# Patient Record
Sex: Male | Born: 1980
Health system: Southern US, Community
[De-identification: ages and names within clinical notes are randomized; demographics above are authoritative.]

## PROBLEM LIST (undated history)

## (undated) DIAGNOSIS — F909 Attention-deficit hyperactivity disorder, unspecified type: Secondary | ICD-10-CM

## (undated) HISTORY — PX: INNER EAR SURGERY: SHX679

## (undated) HISTORY — PX: TONSILLECTOMY: SUR1361

## (undated) HISTORY — PX: NOSE SURGERY: SHX723

---

## 2007-05-09 ENCOUNTER — Emergency Department (HOSPITAL_COMMUNITY): Admission: EM | Admit: 2007-05-09 | Discharge: 2007-05-09 | Payer: Self-pay | Admitting: Emergency Medicine

## 2007-10-10 ENCOUNTER — Emergency Department (HOSPITAL_COMMUNITY): Admission: EM | Admit: 2007-10-10 | Discharge: 2007-10-10 | Payer: Self-pay | Admitting: Emergency Medicine

## 2009-07-15 ENCOUNTER — Ambulatory Visit (HOSPITAL_COMMUNITY): Admission: RE | Admit: 2009-07-15 | Discharge: 2009-07-15 | Payer: Self-pay | Admitting: Orthopaedic Surgery

## 2009-12-22 ENCOUNTER — Encounter: Admission: RE | Admit: 2009-12-22 | Discharge: 2009-12-22 | Payer: Self-pay | Admitting: Otolaryngology

## 2011-01-31 ENCOUNTER — Ambulatory Visit (HOSPITAL_COMMUNITY)
Admission: RE | Admit: 2011-01-31 | Discharge: 2011-01-31 | Disposition: A | Discharge status: Home / Self Care | Payer: BC Managed Care – PPO | Source: Ambulatory Visit | Attending: Pulmonary Disease | Admitting: Pulmonary Disease

## 2011-01-31 ENCOUNTER — Other Ambulatory Visit (HOSPITAL_COMMUNITY): Payer: Self-pay | Admitting: Pulmonary Disease

## 2011-01-31 DIAGNOSIS — J342 Deviated nasal septum: Secondary | ICD-10-CM | POA: Insufficient documentation

## 2011-01-31 DIAGNOSIS — R51 Headache: Secondary | ICD-10-CM | POA: Insufficient documentation

## 2011-01-31 DIAGNOSIS — S022XXA Fracture of nasal bones, initial encounter for closed fracture: Secondary | ICD-10-CM

## 2011-01-31 DIAGNOSIS — S0993XA Unspecified injury of face, initial encounter: Secondary | ICD-10-CM | POA: Insufficient documentation

## 2011-01-31 DIAGNOSIS — X58XXXA Exposure to other specified factors, initial encounter: Secondary | ICD-10-CM | POA: Insufficient documentation

## 2012-05-30 ENCOUNTER — Encounter (HOSPITAL_COMMUNITY): Payer: Self-pay | Admitting: *Deleted

## 2012-05-30 ENCOUNTER — Emergency Department (HOSPITAL_COMMUNITY)
Admission: EM | Admit: 2012-05-30 | Discharge: 2012-05-30 | Disposition: A | Discharge status: Home / Self Care | Payer: BC Managed Care – PPO | Attending: Emergency Medicine | Admitting: Emergency Medicine

## 2012-05-30 ENCOUNTER — Emergency Department (HOSPITAL_COMMUNITY): Payer: BC Managed Care – PPO

## 2012-05-30 DIAGNOSIS — W1789XA Other fall from one level to another, initial encounter: Secondary | ICD-10-CM | POA: Insufficient documentation

## 2012-05-30 DIAGNOSIS — IMO0002 Reserved for concepts with insufficient information to code with codable children: Secondary | ICD-10-CM | POA: Insufficient documentation

## 2012-05-30 DIAGNOSIS — H539 Unspecified visual disturbance: Secondary | ICD-10-CM | POA: Insufficient documentation

## 2012-05-30 DIAGNOSIS — S0990XA Unspecified injury of head, initial encounter: Secondary | ICD-10-CM | POA: Insufficient documentation

## 2012-05-30 DIAGNOSIS — M542 Cervicalgia: Secondary | ICD-10-CM | POA: Insufficient documentation

## 2012-05-30 DIAGNOSIS — R51 Headache: Secondary | ICD-10-CM | POA: Insufficient documentation

## 2012-05-30 HISTORY — DX: Attention-deficit hyperactivity disorder, unspecified type: F90.9

## 2012-05-30 NOTE — ED Notes (Signed)
C- Collar removed by MD at bedside.

## 2012-05-30 NOTE — Discharge Instructions (Signed)
Rest this weekend.  Tylenol or motrin for pain.  Follow up with your md next week for recheck

## 2012-05-30 NOTE — ED Notes (Addendum)
Fell app 12 feet from retaining wall , struck rt side of head  Contusion to rt upper chest.  Loc after fall,  Memory problem after fall.  Alert, talking, Rt arm "sore" C collar placed at triage

## 2012-05-30 NOTE — ED Notes (Signed)
States that he is feeling okay, states he has a little bit of a headache.  No numbness or tingling.  States that his vision is "off" in his right eye from his baseline.

## 2012-05-30 NOTE — ED Provider Notes (Cosign Needed)
History   This chart was scribed for David Lennert, MD by Charolett Bumpers . The patient was seen in room APA08/APA08.    CSN: [id number]  Arrival date & time 05/30/12  1478   First MD Initiated Contact with Patient 05/30/12 1830      Chief Complaint  Patient presents with  . Fall    (Consider location/radiation/quality/duration/timing/severity/associated sxs/prior treatment) HPI Comments: Patient states that he feel 12 feet from a retaining wall just PTA. Patient states that he hit the right side of his chest and chin. Patient reports associated right shoulder pain and headache. Patient states that he doesn't remember 30 minutes after falling. Patient reports LOC after fall. Patient describes right shoulder pain as soreness. Patient also reports blurry vision in right eye on his way to ED. Patient denies any other injuries. Patient states that the impact surface was level dirt.   PCP: Dr. Ouida Sills  Patient is a 31 y.o. male presenting with fall. The history is provided by the patient.  Fall The accident occurred less than 1 hour ago. He fell from a height of 11 to 15 ft. He landed on dirt. There was no blood loss. The point of impact was the head (and right side of chest). The pain is present in the right shoulder and head. The pain is mild. He was ambulatory at the scene. Associated symptoms include headaches and loss of consciousness. He has tried nothing for the symptoms.    Past Medical History  Diagnosis Date  . ADHD (attention deficit hyperactivity disorder)     Past Surgical History  Procedure Date  . Inner ear surgery     History reviewed. No pertinent family history.  History  Substance Use Topics  . Smoking status: Never Smoker   . Smokeless tobacco: Not on file  . Alcohol Use: Yes      Review of Systems  Eyes: Positive for visual disturbance.  Musculoskeletal: Positive for myalgias.  Neurological: Positive for loss of consciousness and headaches.    All other systems reviewed and are negative.    Allergies  Review of patient's allergies indicates no known allergies.  Home Medications   Current Outpatient Rx  Name Route Sig Dispense Refill  . AMPHETAMINE-DEXTROAMPHETAMINE 10 MG PO TABS Oral Take 10 mg by mouth 2 (two) times daily.      BP 121/78  Pulse 75  Temp 98.3 F (36.8 C) (Oral)  Resp 18  Ht 6' (1.829 m)  Wt 180 lb (81.647 kg)  BMI 24.41 kg/m2  SpO2 100%  Physical Exam  Constitutional: He is oriented to person, place, and time. He appears well-developed. Cervical collar in place.  HENT:  Head: Normocephalic and atraumatic.  Eyes: Conjunctivae and EOM are normal. No scleral icterus.  Neck: Neck supple. No thyromegaly present.  Cardiovascular: Normal rate and regular rhythm.  Exam reveals no gallop and no friction rub.   No murmur heard. Pulmonary/Chest: No stridor. He has no wheezes. He has no rales. He exhibits no tenderness.  Abdominal: He exhibits no distension. There is no tenderness. There is no rebound.  Musculoskeletal: Normal range of motion. He exhibits no edema.  Lymphadenopathy:    He has no cervical adenopathy.  Neurological: He is oriented to person, place, and time. Coordination normal.  Skin: Abrasion noted. No rash noted. No erythema.       Abrasions to right chest.   Psychiatric: He has a normal mood and affect. His behavior is normal.    ED  Course  Procedures (including critical care time)  DIAGNOSTIC STUDIES: Oxygen Saturation is 100% on room air, normal by my interpretation.    COORDINATION OF CARE:  1838: Discussed planned course of treatment with the patient, who is agreeable at this time. Patient being placed in c-collar.  1917: Recheck: Informed patient of imaging results and removed c-collar. Discussed planned d/c and f/u with PCP.     Labs Reviewed - No data to display Ct Head Wo Contrast  05/30/2012  *RADIOLOGY REPORT*  Clinical Data:  History of fall complaining of head  and neck pain.  CT HEAD WITHOUT CONTRAST CT CERVICAL SPINE WITHOUT CONTRAST  Technique:  Multidetector CT imaging of the head and cervical spine was performed following the standard protocol without intravenous contrast.  Multiplanar CT image reconstructions of the cervical spine were also generated.  Comparison:  No priors.  CT HEAD  Findings: No acute displaced skull fractures are identified.  No acute intracranial abnormalities.  Specifically, no evidence of acute post-traumatic intracranial hemorrhage, no definite evidence of acute/subacute cerebral ischemia, no focal mass, mass effect, hydrocephalus or abnormal intra or extra-axial fluid collections. Visualized paranasal sinuses and mastoids are remarkable for hypoplasia of the left mastoids, with postoperative changes suggesting prior mastoidectomy.  The right mastoids are well aerated.  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  IMPRESSION: 1.  No acute displaced skull fractures or evidence to suggest significant acute traumatic injury to the brain. 2.  Postoperative changes of left mastoidectomy. 3.  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  Alternatively, these findings could be related to chronic sinusitis (no evidence of acute sinusitis at this time).  CT CERVICAL SPINE  Findings: No acute displaced fractures of the cervical spine. Alignment is anatomic.  Prevertebral soft tissues are normal. Visualized portions of the lung apices are unremarkable.  IMPRESSION: 1.  No evidence of significant acute traumatic injury to the cervical spine.  Original Report Authenticated By: Florencia Reasons, M.D.   Ct Cervical Spine Wo Contrast  05/30/2012  *RADIOLOGY REPORT*  Clinical Data:  History of fall complaining of head and neck pain.  CT HEAD WITHOUT CONTRAST CT CERVICAL SPINE WITHOUT CONTRAST   Technique:  Multidetector CT imaging of the head and cervical spine was performed following the standard protocol without intravenous contrast.  Multiplanar CT image reconstructions of the cervical spine were also generated.  Comparison:  No priors.  CT HEAD  Findings: No acute displaced skull fractures are identified.  No acute intracranial abnormalities.  Specifically, no evidence of acute post-traumatic intracranial hemorrhage, no definite evidence of acute/subacute cerebral ischemia, no focal mass, mass effect, hydrocephalus or abnormal intra or extra-axial fluid collections. Visualized paranasal sinuses and mastoids are remarkable for hypoplasia of the left mastoids, with postoperative changes suggesting prior mastoidectomy.  The right mastoids are well aerated.  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  IMPRESSION: 1.  No acute displaced skull fractures or evidence to suggest significant acute traumatic injury to the brain. 2.  Postoperative changes of left mastoidectomy. 3.  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  Alternatively, these findings could be related to chronic sinusitis (no evidence of acute sinusitis at this time).  CT CERVICAL SPINE  Findings: No acute displaced  fractures of the cervical spine. Alignment is anatomic.  Prevertebral soft tissues are normal. Visualized portions of the lung apices are unremarkable.  IMPRESSION: 1.  No evidence of significant acute traumatic injury to the cervical spine.  Original Report Authenticated By: Florencia Reasons, M.D.     No diagnosis found.    MDM    The chart was scribed for me under my direct supervision.  I personally performed the history, physical, and medical decision making and all procedures in the evaluation of this patient.David Lennert, MD 05/30/12  [phone]

## 2012-11-06 ENCOUNTER — Emergency Department (HOSPITAL_COMMUNITY): Payer: BC Managed Care – PPO

## 2012-11-06 ENCOUNTER — Emergency Department (HOSPITAL_COMMUNITY)
Admission: EM | Admit: 2012-11-06 | Discharge: 2012-11-06 | Disposition: A | Discharge status: Home / Self Care | Payer: BC Managed Care – PPO | Attending: Emergency Medicine | Admitting: Emergency Medicine

## 2012-11-06 ENCOUNTER — Encounter (HOSPITAL_COMMUNITY): Payer: Self-pay | Admitting: Emergency Medicine

## 2012-11-06 DIAGNOSIS — S161XXA Strain of muscle, fascia and tendon at neck level, initial encounter: Secondary | ICD-10-CM

## 2012-11-06 DIAGNOSIS — S0993XA Unspecified injury of face, initial encounter: Secondary | ICD-10-CM | POA: Insufficient documentation

## 2012-11-06 DIAGNOSIS — Z79899 Other long term (current) drug therapy: Secondary | ICD-10-CM | POA: Insufficient documentation

## 2012-11-06 DIAGNOSIS — S39012A Strain of muscle, fascia and tendon of lower back, initial encounter: Secondary | ICD-10-CM

## 2012-11-06 DIAGNOSIS — S139XXA Sprain of joints and ligaments of unspecified parts of neck, initial encounter: Secondary | ICD-10-CM | POA: Insufficient documentation

## 2012-11-06 DIAGNOSIS — F909 Attention-deficit hyperactivity disorder, unspecified type: Secondary | ICD-10-CM | POA: Insufficient documentation

## 2012-11-06 DIAGNOSIS — Y92410 Unspecified street and highway as the place of occurrence of the external cause: Secondary | ICD-10-CM | POA: Insufficient documentation

## 2012-11-06 DIAGNOSIS — S199XXA Unspecified injury of neck, initial encounter: Secondary | ICD-10-CM | POA: Insufficient documentation

## 2012-11-06 DIAGNOSIS — S335XXA Sprain of ligaments of lumbar spine, initial encounter: Secondary | ICD-10-CM | POA: Insufficient documentation

## 2012-11-06 DIAGNOSIS — Y9389 Activity, other specified: Secondary | ICD-10-CM | POA: Insufficient documentation

## 2012-11-06 MED ORDER — HYDROCODONE-ACETAMINOPHEN 5-325 MG PO TABS: 1.0000 | ORAL_TABLET | Freq: Four times a day (QID) | ORAL | Status: AC | PRN

## 2012-11-06 MED ORDER — CYCLOBENZAPRINE HCL 10 MG PO TABS: ORAL_TABLET | ORAL | Status: DC
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
## 2012-11-06 NOTE — ED Provider Notes (Signed)
Medical screening examination/treatment/procedure(s) were performed by non-physician practitioner and as supervising physician I was immediately available for consultation/collaboration.   Delisia Mcquiston M Sadie Pickar, MD 11/06/12 1652 

## 2012-12-10 ENCOUNTER — Other Ambulatory Visit (HOSPITAL_COMMUNITY): Payer: Self-pay | Admitting: Orthopaedic Surgery

## 2012-12-10 DIAGNOSIS — R200 Anesthesia of skin: Secondary | ICD-10-CM

## 2012-12-15 ENCOUNTER — Ambulatory Visit (HOSPITAL_COMMUNITY)
Admission: RE | Admit: 2012-12-15 | Discharge: 2012-12-15 | Disposition: A | Discharge status: Home / Self Care | Payer: BC Managed Care – PPO | Source: Ambulatory Visit | Attending: Orthopaedic Surgery | Admitting: Orthopaedic Surgery

## 2012-12-15 DIAGNOSIS — R200 Anesthesia of skin: Secondary | ICD-10-CM

## 2012-12-15 DIAGNOSIS — M542 Cervicalgia: Secondary | ICD-10-CM | POA: Insufficient documentation

## 2012-12-15 DIAGNOSIS — M5030 Other cervical disc degeneration, unspecified cervical region: Secondary | ICD-10-CM | POA: Insufficient documentation
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
## 2014-09-02 ENCOUNTER — Emergency Department (HOSPITAL_COMMUNITY)
Admission: EM | Admit: 2014-09-02 | Discharge: 2014-09-03 | Disposition: A | Discharge status: Home / Self Care | Payer: BC Managed Care – PPO | Attending: Emergency Medicine | Admitting: Emergency Medicine

## 2014-09-02 ENCOUNTER — Encounter (HOSPITAL_COMMUNITY): Payer: Self-pay | Admitting: Emergency Medicine

## 2014-09-02 DIAGNOSIS — F909 Attention-deficit hyperactivity disorder, unspecified type: Secondary | ICD-10-CM | POA: Diagnosis not present

## 2014-09-02 DIAGNOSIS — W208XXA Other cause of strike by thrown, projected or falling object, initial encounter: Secondary | ICD-10-CM | POA: Diagnosis not present

## 2014-09-02 DIAGNOSIS — Y9389 Activity, other specified: Secondary | ICD-10-CM | POA: Insufficient documentation

## 2014-09-02 DIAGNOSIS — Y9289 Other specified places as the place of occurrence of the external cause: Secondary | ICD-10-CM | POA: Diagnosis not present

## 2014-09-02 DIAGNOSIS — S199XXA Unspecified injury of neck, initial encounter: Secondary | ICD-10-CM | POA: Insufficient documentation

## 2014-09-02 DIAGNOSIS — Z79899 Other long term (current) drug therapy: Secondary | ICD-10-CM | POA: Insufficient documentation

## 2014-09-02 DIAGNOSIS — S0990XA Unspecified injury of head, initial encounter: Secondary | ICD-10-CM

## 2014-09-02 DIAGNOSIS — Y990 Civilian activity done for income or pay: Secondary | ICD-10-CM | POA: Insufficient documentation

## 2014-09-02 NOTE — ED Notes (Signed)
Patient states he has been slightly disoriented today
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
## 2014-09-03 MED ORDER — TIZANIDINE HCL 2 MG PO CAPS: 2.0000 mg | ORAL_CAPSULE | Freq: Three times a day (TID) | ORAL | Status: DC | PRN

## 2014-09-03 NOTE — Discharge Instructions (Signed)
Head Injury You have a head injury. Headaches and throwing up (vomiting) are common after a head injury. It should be easy to wake up from sleeping. Sometimes you must stay in the hospital. Most problems happen within the first 24 hours. Side effects may occur up to 7-10 days after the injury.  WHAT ARE THE TYPES OF HEAD INJURIES? Head injuries can be as minor as a bump. Some head injuries can be more severe. More severe head injuries include:  A jarring injury to the brain (concussion).  A bruise of the brain (contusion). This mean there is bleeding in the brain that can cause swelling.  A cracked skull (skull fracture).  Bleeding in the brain that collects, clots, and forms a bump (hematoma). WHEN SHOULD I GET HELP RIGHT AWAY?   You are confused or sleepy.  You cannot be woken up.  You feel sick to your stomach (nauseous) or keep throwing up (vomiting).  Your dizziness or unsteadiness is getting worse.  You have very bad, lasting headaches that are not helped by medicine. Take medicines only as told by your doctor.  You cannot use your arms or legs like normal.  You cannot walk.  You notice changes in the black spots in the center of the colored part of your eye (pupil).  You have clear or bloody fluid coming from your nose or ears.  You have trouble seeing. HOW CAN I PREVENT A HEAD INJURY IN THE FUTURE?  Wear seat belts.  Wear a helmet while bike riding and playing sports like football.  Stay away from dangerous activities around the house. WHEN CAN I RETURN TO NORMAL ACTIVITIES AND ATHLETICS? See your doctor before doing these activities. You should not do normal activities or play contact sports until 1 week after the following symptoms have stopped:  Headache that does not go away.  Dizziness.  Poor attention.  Confusion.  Memory problems.  Sickness to your stomach or throwing up.  Tiredness.  Fussiness.  Bothered by bright lights or loud  noises.  Anxiousness or depression.  Restless sleep. MAKE SURE YOU:   Understand these instructions.  Will watch your condition.  Will get help right away if you are not doing well or get worse. Document Released: 11/01/2008 Document Revised: 04/05/2014 Document Reviewed: 07/27/2013 [facility] Patient Information 2015 [employer] [place]. This information is not intended to replace advice given to you by your health care provider. Make sure you discuss any questions you have with your health care provider.   Take tylenol or motrin for headache pain.  You may also benefit from a muscle relaxer which has been prescribed for you.
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
## 2014-09-03 NOTE — ED Notes (Signed)
Pt alert & oriented x4, stable gait. Patient given discharge instructions, paperwork & prescription(s). Patient  instructed to stop at the registration desk to finish any additional paperwork. Patient verbalized understanding. Pt left department w/ no further questions. 

## 2014-09-03 NOTE — ED Provider Notes (Signed)
Medical screening examination/treatment/procedure(s) were performed by non-physician practitioner and as supervising physician I was immediately available for consultation/collaboration.   EKG Interpretation None       Derwood KaplanAnkit Clio Gerhart, MD 09/03/14 [phone]

## 2016-01-26 ENCOUNTER — Telehealth: Payer: Self-pay | Admitting: *Deleted

## 2016-01-26 MED ORDER — HYDROCODONE-ACETAMINOPHEN 7.5-325 MG PO TABS: 1.0000 | ORAL_TABLET | ORAL | Status: DC | PRN

## 2016-01-26 NOTE — Telephone Encounter (Signed)
Patient called requesting hydrocodone 7.5 qty 120. Please advise [phone]

## 2016-01-26 NOTE — Telephone Encounter (Signed)
Patient called back; aware prescription ready for pick-up.  Requested that his designated party contact, David Mccarthy, pick it up, as was approved per notes in Dr Sanjuan Dame previous medical records system.  Relayed to patient that a new form and updated copy of driver license is needed.  Patient's friend, David Mccarthy also aware, and has come by with proof of identification and picked up prescription. Both parties voiced understanding of new system and processes.

## 2016-01-26 NOTE — Telephone Encounter (Signed)
Rx printed

## 2016-02-23 ENCOUNTER — Telehealth: Payer: Self-pay | Admitting: Orthopaedic Surgery

## 2016-02-23 MED ORDER — HYDROCODONE-ACETAMINOPHEN 7.5-325 MG PO TABS: 1.0000 | ORAL_TABLET | ORAL | Status: DC | PRN

## 2016-02-23 NOTE — Telephone Encounter (Signed)
Rx Done . 

## 2016-02-23 NOTE — Telephone Encounter (Signed)
Patient called and requested a refill on Norco 7.5-325 mgs.  Qty 120

## 2016-03-14 ENCOUNTER — Telehealth: Payer: Self-pay | Admitting: Orthopaedic Surgery

## 2016-03-14 MED ORDER — CYCLOBENZAPRINE HCL 10 MG PO TABS: ORAL_TABLET | ORAL | Status: DC

## 2016-03-14 MED ORDER — NAPROXEN 500 MG PO TABS: 500.0000 mg | ORAL_TABLET | Freq: Two times a day (BID) | ORAL | Status: DC

## 2016-03-14 NOTE — Telephone Encounter (Signed)
Patient called and requested a refill on Flexeril 10 mgs. Qty 60 with 4 refills and Naprosyn 500 mgs. Qty 60 with 5 refills

## 2016-03-14 NOTE — Telephone Encounter (Signed)
Rx done. 

## 2016-03-22 ENCOUNTER — Telehealth: Payer: Self-pay | Admitting: Orthopaedic Surgery

## 2016-03-22 MED ORDER — HYDROCODONE-ACETAMINOPHEN 7.5-325 MG PO TABS: 1.0000 | ORAL_TABLET | ORAL | Status: DC | PRN

## 2016-03-22 NOTE — Telephone Encounter (Signed)
Patient requests refill of medication: HYDROcodone-acetaminophen (NORCO) 7.5-325 MG tablet [[id number]][[id number]] 120 tablets.

## 2016-03-22 NOTE — Telephone Encounter (Signed)
Rx done. 

## 2016-03-29 ENCOUNTER — Encounter: Payer: Self-pay | Admitting: Orthopaedic Surgery

## 2016-03-29 ENCOUNTER — Ambulatory Visit (INDEPENDENT_AMBULATORY_CARE_PROVIDER_SITE_OTHER): Payer: BLUE CROSS/BLUE SHIELD | Admitting: Orthopaedic Surgery

## 2016-03-29 VITALS — BP 133/79 | HR 88 | Temp 98.1°F | Ht 74.0 in | Wt 178.0 lb

## 2016-03-29 DIAGNOSIS — M542 Cervicalgia: Secondary | ICD-10-CM | POA: Diagnosis not present

## 2016-03-29 MED ORDER — CYCLOBENZAPRINE HCL 10 MG PO TABS: ORAL_TABLET | ORAL | Status: DC

## 2016-03-29 NOTE — Progress Notes (Signed)
Patient David Mccarthy, male DOB:12-28-1980, 35 y.o. EAV:[id number]  Chief Complaint  Patient presents with  . Follow-up    neck    HPI  David Mccarthy is a 35 y.o. male who has chronic neck pain.  He has been doing well until about two weeks ago when he had a flare up.  He had no paresthesias.  He went to a massage specialist and is much better now.  He has been doing his exercises and taking his medicine.  He uses ice now and then. He is working regularly. HPI  Body mass index is 22.84 kg/(m^2).  Review of Systems  HENT: Negative for congestion.   Respiratory: Negative for cough and shortness of breath.   Cardiovascular: Negative for chest pain and leg swelling.  Endocrine: Negative for cold intolerance.  Musculoskeletal: Positive for myalgias, arthralgias and neck pain.  Allergic/Immunologic: Negative for environmental allergies.    Past Medical History  Diagnosis Date  . ADHD (attention deficit hyperactivity disorder)     Past Surgical History  Procedure Laterality Date  . Inner ear surgery    . Tonsillectomy    . Nose surgery      History reviewed. No pertinent family history.  Social History Social History  Substance Use Topics  . Smoking status: Never Smoker   . Smokeless tobacco: Never Used  . Alcohol Use: Yes     Comment: occasional, once every 2 weeks    No Known Allergies  Current Outpatient Prescriptions  Medication Sig Dispense Refill  . amphetamine-dextroamphetamine (ADDERALL) 10 MG tablet Take 10 mg by mouth 2 (two) times daily.    . cyclobenzaprine (FLEXERIL) 10 MG tablet 1/2 to one tab po TID prn pain 60 tablet 4  . HYDROcodone-acetaminophen (NORCO) 7.5-325 MG tablet Take 1 tablet by mouth every 4 (four) hours as needed for moderate pain (Must last 30 days.  Do not drive or operate machinery while taking this medicine.). 120 tablet 0  . naproxen (NAPROSYN) 500 MG tablet Take 1 tablet (500 mg total) by mouth 2 (two) times daily with a meal. 60  tablet 5  . tizanidine (ZANAFLEX) 2 MG capsule Take 1 capsule (2 mg total) by mouth 3 (three) times daily as needed for muscle spasms. (Patient not taking: Reported on 03/29/2016) 15 capsule 0   No current facility-administered medications for this visit.     Physical Exam  Blood pressure 133/79, pulse 88, temperature 98.1 F (36.7 C), height  (1.88 m), weight 178 lb (80.74 kg).  Constitutional: overall normal hygiene, normal nutrition, well developed, normal grooming, normal body habitus. Assistive device:none  Musculoskeletal: gait and station Limp none, muscle tone and strength are normal, no tremors or atrophy is present.  .  Neurological: coordination overall normal.  Deep tendon reflex/nerve stretch intact.  Sensation normal.  Cranial nerves II-XII intact.   Skin:   normal overall no scars, lesions, ulcers or rashes. No psoriasis.  Psychiatric: Alert and oriented x 3.  Recent memory intact, remote memory unclear.  Normal mood and affect. Well groomed.  Good eye contact.  Cardiovascular: overall no swelling, no varicosities, no edema bilaterally, normal temperatures of the legs and arms, no clubbing, cyanosis and good capillary refill.  Lymphatic: palpation is normal. His neck is tender in the mid posterior area with no spasm.  He has full ROM.  Reflexes are normal.  Strength is normal.  He has no weakness.  The patient has been educated about the nature of the problem(s) and counseled  on treatment options.  The patient appeared to understand what I have discussed and is in agreement with it.  Encounter Diagnosis  Name Primary?  . Neck pain Yes    PLAN Call if any problems.  Precautions discussed.  Continue current medications.   Return to clinic 3 months

## 2016-04-19 ENCOUNTER — Telehealth: Payer: Self-pay | Admitting: Orthopaedic Surgery

## 2016-04-19 MED ORDER — HYDROCODONE-ACETAMINOPHEN 7.5-325 MG PO TABS: 1.0000 | ORAL_TABLET | ORAL | Status: DC | PRN

## 2016-04-19 NOTE — Telephone Encounter (Signed)
Patient called to request refill, medication: HYDROcodone-acetaminophen (NORCO) 7.5-325 MG tablet [[id number]][[id number]] - quantity 120.  Please advise.

## 2016-04-19 NOTE — Telephone Encounter (Signed)
Rx done. 

## 2016-05-21 ENCOUNTER — Telehealth: Payer: Self-pay | Admitting: Orthopaedic Surgery

## 2016-05-21 MED ORDER — HYDROCODONE-ACETAMINOPHEN 7.5-325 MG PO TABS: 1.0000 | ORAL_TABLET | ORAL | Status: DC | PRN

## 2016-05-21 NOTE — Telephone Encounter (Signed)
Patient called and requested a refill on Hydrocodone-Acetaminophen (Norco)  7.5-325 mgs. Qty 120 Sig: Take 1 tablet by mouth every 4 (four) hours as needed for moderate pain (Must last 30 days.  Do not drive or operate machinery while taking this medicine.). °

## 2016-05-21 NOTE — Telephone Encounter (Signed)
Rx done. 

## 2016-06-20 ENCOUNTER — Telehealth: Payer: Self-pay | Admitting: Orthopaedic Surgery

## 2016-06-20 MED ORDER — HYDROCODONE-ACETAMINOPHEN 5-325 MG PO TABS: 1.0000 | ORAL_TABLET | ORAL | Status: DC | PRN

## 2016-06-20 NOTE — Telephone Encounter (Signed)
Patient called for refill of: HYDROcodone-acetaminophen (NORCO) 7.5-325 MG tablet  - quantity 120.

## 2016-06-20 NOTE — Telephone Encounter (Signed)
Rx Done . 

## 2016-06-28 ENCOUNTER — Ambulatory Visit: Payer: BLUE CROSS/BLUE SHIELD | Admitting: Orthopaedic Surgery

## 2016-07-26 ENCOUNTER — Telehealth: Payer: Self-pay | Admitting: Orthopaedic Surgery

## 2016-07-26 MED ORDER — HYDROCODONE-ACETAMINOPHEN 5-325 MG PO TABS: 1.0000 | ORAL_TABLET | Freq: Four times a day (QID) | ORAL | 0 refills | Status: DC | PRN

## 2016-07-26 NOTE — Telephone Encounter (Signed)
Patient called and requested a refill on Hydrocodone/Acetaminophen 7.5-325 mgs.  Qty  120        Sig: Take 1 tablet by mouth every 4 (four) hours as needed for moderate pain (Must last 30 days. Do not take and drive a car or use machinery.).

## 2016-07-31 ENCOUNTER — Ambulatory Visit (INDEPENDENT_AMBULATORY_CARE_PROVIDER_SITE_OTHER): Payer: Self-pay | Admitting: Orthopaedic Surgery

## 2016-07-31 ENCOUNTER — Encounter: Payer: Self-pay | Admitting: Orthopaedic Surgery

## 2016-07-31 VITALS — BP 122/78 | HR 88 | Temp 97.9°F | Ht 74.0 in | Wt 182.0 lb

## 2016-07-31 DIAGNOSIS — M542 Cervicalgia: Secondary | ICD-10-CM

## 2016-07-31 NOTE — Progress Notes (Signed)
Patient JY:NWGN:David Mccarthy, male DOB:10-30-1981, 35 y.o. FAO:[id number]  Chief Complaint  Patient presents with  . Follow-up    neck pain    HPI  David Mccarthy is a 35 y.o. male who has neck pain chronically.  He works [employer].  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  [place] work there.  He has no paresthesias.  He has no new trauma.  He is doing his exercises and taking his medicine. HPI  Body mass index is 23.37 kg/m.  ROS  Review of Systems  HENT: Negative for congestion.   Respiratory: Negative for cough and shortness of breath.   Cardiovascular: Negative for chest pain and leg swelling.  Endocrine: Negative for cold intolerance.  Musculoskeletal: Positive for arthralgias, myalgias and neck pain.  Allergic/Immunologic: Negative for environmental allergies.    Past Medical History:  Diagnosis Date  . ADHD (attention deficit hyperactivity disorder)     Past Surgical History:  Procedure Laterality Date  . INNER EAR SURGERY    . NOSE SURGERY    . TONSILLECTOMY      No family history on file.  Social History Social History  Substance Use Topics  . Smoking status: Never Smoker  . Smokeless tobacco: Never Used  . Alcohol use Yes     Comment: occasional, once every 2 weeks    No Known Allergies  Current Outpatient Prescriptions  Medication Sig Dispense Refill  . amphetamine-dextroamphetamine (ADDERALL) 10 MG tablet Take 10 mg by mouth 2 (two) times daily.    . cyclobenzaprine (FLEXERIL) 10 MG tablet 1/2 to one tab po TID prn pain 60 tablet 4  . HYDROcodone-acetaminophen (NORCO/VICODIN) 5-325 MG tablet Take 1 tablet by mouth every 6 (six) hours as needed for moderate pain (Must last 30 days.Do not take and drive a car or use machinery.). 110 tablet 0  . naproxen (NAPROSYN) 500 MG tablet Take 1 tablet (500 mg total) by mouth 2 (two) times daily with a meal. 60 tablet 5  .  tizanidine (ZANAFLEX) 2 MG capsule Take 1 capsule (2 mg total) by mouth 3 (three) times daily as needed for muscle spasms. 15 capsule 0   No current facility-administered medications for this visit.      Physical Exam  Blood pressure 122/78, pulse 88, temperature 97.9 F (36.6 C), height 6\' 2"  (1.88 m), weight 182 lb (82.6 kg).  Constitutional: overall normal hygiene, normal nutrition, well developed, normal grooming, normal body habitus. Assistive device:none  Musculoskeletal: gait and station Limp none, muscle tone and strength are normal, no tremors or atrophy is present.  .  Neurological: coordination overall normal.  Deep tendon reflex/nerve stretch intact.  Sensation normal.  Cranial nerves II-XII intact.   Skin:   normal overall no scars, lesions, ulcers or rashes. No psoriasis.  Psychiatric: Alert and oriented x 3.  Recent memory intact, remote memory unclear.  Normal mood and affect. Well groomed.  Good eye contact.  Cardiovascular: overall no swelling, no varicosities, no edema bilaterally, normal temperatures of the legs and arms, no clubbing, cyanosis and good capillary refill.  Lymphatic: palpation is normal.  Neck is tender to the left.  He has some left nuchal and left upper trapezius tenderness but no spasm.  NV intact. Grips normal. Shoulders negative.   The patient has been educated about the nature of the problem(s) and counseled on treatment options.  The  patient appeared to understand what I have discussed and is in agreement with it.  No diagnosis found.  PLAN Call if any problems.  Precautions discussed.  Continue current medications.   Return to clinic 3 months   Electronically Signed Darreld Mclean, MD 8/29/20173:54 PM

## 2016-08-01 ENCOUNTER — Ambulatory Visit: Payer: BLUE CROSS/BLUE SHIELD | Admitting: Orthopaedic Surgery

## 2016-08-22 ENCOUNTER — Telehealth: Payer: Self-pay | Admitting: Orthopaedic Surgery

## 2016-08-22 ENCOUNTER — Telehealth: Payer: Self-pay | Admitting: Orthopedic Surgery

## 2016-08-22 MED ORDER — HYDROCODONE-ACETAMINOPHEN 5-325 MG PO TABS: 1.0000 | ORAL_TABLET | Freq: Four times a day (QID) | ORAL | 0 refills | Status: DC | PRN

## 2016-08-22 NOTE — Telephone Encounter (Signed)
No note

## 2016-08-22 NOTE — Telephone Encounter (Signed)
Patient requests refill:  HYDROcodone-acetaminophen (NORCO/VICODIN) 5-325 MG tablet 110 tablet  - states no insurance at this time

## 2016-09-24 ENCOUNTER — Telehealth: Payer: Self-pay | Admitting: Orthopaedic Surgery

## 2016-09-24 NOTE — Telephone Encounter (Signed)
Patient requests a refill on Hydrocodone/Acetaminophyen (Norco)  5-325 mgs.    Qty   110  Sig: Take 1 tablet by mouth every 6 (six) hours as needed for moderate pain (Must last 30 days.Do not take and drive a car or use machinery.).

## 2016-09-25 MED ORDER — HYDROCODONE-ACETAMINOPHEN 5-325 MG PO TABS: 1.0000 | ORAL_TABLET | Freq: Four times a day (QID) | ORAL | 0 refills | Status: DC | PRN

## 2016-10-23 ENCOUNTER — Telehealth: Payer: Self-pay | Admitting: Orthopaedic Surgery

## 2016-10-23 MED ORDER — HYDROCODONE-ACETAMINOPHEN 5-325 MG PO TABS: 1.0000 | ORAL_TABLET | Freq: Four times a day (QID) | ORAL | 0 refills | Status: DC | PRN

## 2016-10-23 NOTE — Telephone Encounter (Signed)
Patient requests a refill on Hydrocodone/Acetaminophen 5-325 mgs.    Qty  110   Sig: Take 1 tablet by mouth every 6 (six) hours as needed for moderate pain (Must last 30 days.Do not take and drive a car or use machinery.).

## 2016-10-30 ENCOUNTER — Ambulatory Visit: Payer: Self-pay | Admitting: Orthopaedic Surgery

## 2016-11-01 ENCOUNTER — Ambulatory Visit (INDEPENDENT_AMBULATORY_CARE_PROVIDER_SITE_OTHER): Payer: Self-pay | Admitting: Orthopaedic Surgery

## 2016-11-01 ENCOUNTER — Encounter: Payer: Self-pay | Admitting: Orthopaedic Surgery

## 2016-11-01 VITALS — BP 136/82 | HR 77 | Temp 98.1°F | Ht 74.0 in | Wt 189.0 lb

## 2016-11-01 DIAGNOSIS — M542 Cervicalgia: Secondary | ICD-10-CM

## 2016-11-01 NOTE — Progress Notes (Signed)
Patient ZO:XWRU:David Mccarthy Noe, male DOB:1981-11-06, 35 y.o. EAV:[id number]  Chief Complaint  Patient presents with  . Follow-up    neck pain    HPI  David Mccarthy is a 35 y.o. male who has chronic neck pain.  He is stable. He has no paresthesias. He is taking his medicine and being active.  He has no trauma. HPI  Body mass index is 24.27 kg/m.  ROS  Review of Systems  HENT: Negative for congestion.   Respiratory: Negative for cough and shortness of breath.   Cardiovascular: Negative for chest pain and leg swelling.  Endocrine: Negative for cold intolerance.  Musculoskeletal: Positive for arthralgias, myalgias and neck pain.  Allergic/Immunologic: Negative for environmental allergies.    Past Medical History:  Diagnosis Date  . ADHD (attention deficit hyperactivity disorder)     Past Surgical History:  Procedure Laterality Date  . INNER EAR SURGERY    . NOSE SURGERY    . TONSILLECTOMY      No family history on file.  Social History Social History  Substance Use Topics  . Smoking status: Never Smoker  . Smokeless tobacco: Never Used  . Alcohol use Yes     Comment: occasional, once every 2 weeks    No Known Allergies  Current Outpatient Prescriptions  Medication Sig Dispense Refill  . amphetamine-dextroamphetamine (ADDERALL) 10 MG tablet Take 10 mg by mouth 2 (two) times daily.    . cyclobenzaprine (FLEXERIL) 10 MG tablet 1/2 to one tab po TID prn pain 60 tablet 4  . HYDROcodone-acetaminophen (NORCO/VICODIN) 5-325 MG tablet Take 1 tablet by mouth every 6 (six) hours as needed for moderate pain (Must last 30 days.Do not take and drive a car or use machinery.). 110 tablet 0  . naproxen (NAPROSYN) 500 MG tablet Take 1 tablet (500 mg total) by mouth 2 (two) times daily with a meal. 60 tablet 5  . tizanidine (ZANAFLEX) 2 MG capsule Take 1 capsule (2 mg total) by mouth 3 (three) times daily as needed for muscle spasms. 15 capsule 0   No current facility-administered  medications for this visit.      Physical Exam  Blood pressure 136/82, pulse 77, temperature 98.1 F (36.7 C), height 6\' 2"  (1.88 m), weight 189 lb (85.7 kg).  Constitutional: overall normal hygiene, normal nutrition, well developed, normal grooming, normal body habitus. Assistive device:none  Musculoskeletal: gait and station Limp none, muscle tone and strength are normal, no tremors or atrophy is present.  .  Neurological: coordination overall normal.  Deep tendon reflex/nerve stretch intact.  Sensation normal.  Cranial nerves II-XII intact.   Skin:   Normal overall no scars, lesions, ulcers or rashes. No psoriasis.  Psychiatric: Alert and oriented x 3.  Recent memory intact, remote memory unclear.  Normal mood and affect. Well groomed.  Good eye contact.  Cardiovascular: overall no swelling, no varicosities, no edema bilaterally, normal temperatures of the legs and arms, no clubbing, cyanosis and good capillary refill.  Lymphatic: palpation is normal.  Spine/Pelvis examination:  Inspection:  Overall, sacoiliac joint benign and hips nontender; without crepitus or defects.   Thoracic spine inspection: Alignment normal without kyphosis present   Lumbar spine inspection:  Alignment  with normal lumbar lordosis, without scoliosis apparent.   Thoracic spine palpation:  without tenderness of spinal processes   Lumbar spine palpation: with tenderness of lumbar area; without tightness of lumbar muscles    Range of Motion:   Lumbar flexion, forward flexion is 50 without pain or  tenderness    Lumbar extension is full without pain or tenderness   Left lateral bend is Normal  without pain or tenderness   Right lateral bend is Normal without pain or tenderness   Straight leg raising is Normal   Strength & tone: Normal   Stability overall normal stability     The patient has been educated about the nature of the problem(s) and counseled on treatment options.  The patient appeared  to understand what I have discussed and is in agreement with it.  Encounter Diagnosis  Name Primary?  . Neck pain Yes    PLAN Call if any problems.  Precautions discussed.  Continue current medications.   Return to clinic 3 months   Electronically Signed Darreld McleanWayne Zandon Talton, MD 11/30/20173:30 PM

## 2016-11-23 ENCOUNTER — Telehealth: Payer: Self-pay | Admitting: Orthopaedic Surgery

## 2016-11-23 NOTE — Telephone Encounter (Signed)
Patient requests a refill on Hydrocodone/Acetaminophen 5-325  Mgs.   Qty  110  Sig: Take 1 tablet by mouth every 6 (six) hours as needed for moderate pain (Must last 30 days.Do not take and drive a car or use machinery.).

## 2016-11-28 MED ORDER — HYDROCODONE-ACETAMINOPHEN 5-325 MG PO TABS: 1.0000 | ORAL_TABLET | Freq: Four times a day (QID) | ORAL | 0 refills | Status: DC | PRN

## 2016-12-28 ENCOUNTER — Telehealth: Payer: Self-pay | Admitting: Orthopaedic Surgery

## 2016-12-28 NOTE — Telephone Encounter (Signed)
Hydrocodone-Acetaminophen  5/325mg  Qty 110 Tablets °

## 2016-12-31 MED ORDER — HYDROCODONE-ACETAMINOPHEN 5-325 MG PO TABS: 1.0000 | ORAL_TABLET | Freq: Four times a day (QID) | ORAL | 0 refills | Status: DC | PRN

## 2017-01-29 ENCOUNTER — Telehealth: Payer: Self-pay | Admitting: Orthopaedic Surgery

## 2017-01-29 MED ORDER — HYDROCODONE-ACETAMINOPHEN 5-325 MG PO TABS: 1.0000 | ORAL_TABLET | Freq: Four times a day (QID) | ORAL | 0 refills | Status: DC | PRN

## 2017-01-31 ENCOUNTER — Ambulatory Visit: Payer: Self-pay | Admitting: Orthopaedic Surgery

## 2017-02-20 ENCOUNTER — Ambulatory Visit (INDEPENDENT_AMBULATORY_CARE_PROVIDER_SITE_OTHER): Payer: 59 | Admitting: Orthopaedic Surgery

## 2017-02-20 ENCOUNTER — Encounter: Payer: Self-pay | Admitting: Orthopaedic Surgery

## 2017-02-20 VITALS — BP 120/78 | HR 76 | Temp 97.5°F | Ht 74.0 in | Wt 196.0 lb

## 2017-02-20 DIAGNOSIS — M542 Cervicalgia: Secondary | ICD-10-CM

## 2017-02-20 MED ORDER — HYDROCODONE-ACETAMINOPHEN 5-325 MG PO TABS: 1.0000 | ORAL_TABLET | Freq: Four times a day (QID) | ORAL | 0 refills | Status: DC | PRN

## 2017-02-20 NOTE — Progress Notes (Signed)
Patient David Mccarthy, male DOB:10/24/1981, 36 y.o. QMV:[id number]  Chief Complaint  Patient presents with  . Follow-up    neck pain    HPI  David Mccarthy is a 36 y.o. male who had chronic neck pain.  He has no new incidents.  He has no paresthesias.  He has good and bad days.  Cold weather makes it worse.  He is the proud father of a new baby boy who is now 45 weeks old.  Mom and baby are doing well. HPI  Body mass index is 25.16 kg/m.  ROS  Review of Systems  HENT: Negative for congestion.   Respiratory: Negative for cough and shortness of breath.   Cardiovascular: Negative for chest pain and leg swelling.  Endocrine: Negative for cold intolerance.  Musculoskeletal: Positive for arthralgias, myalgias and neck pain.  Allergic/Immunologic: Negative for environmental allergies.    Past Medical History:  Diagnosis Date  . ADHD (attention deficit hyperactivity disorder)     Past Surgical History:  Procedure Laterality Date  . INNER EAR SURGERY    . NOSE SURGERY    . TONSILLECTOMY      No family history on file.  Social History Social History  Substance Use Topics  . Smoking status: Never Smoker  . Smokeless tobacco: Never Used  . Alcohol use Yes     Comment: occasional, once every 2 weeks    No Known Allergies  Current Outpatient Prescriptions  Medication Sig Dispense Refill  . amphetamine-dextroamphetamine (ADDERALL) 10 MG tablet Take 10 mg by mouth 2 (two) times daily.    . cyclobenzaprine (FLEXERIL) 10 MG tablet 1/2 to one tab po TID prn pain 60 tablet 4  . HYDROcodone-acetaminophen (NORCO/VICODIN) 5-325 MG tablet Take 1 tablet by mouth every 6 (six) hours as needed for moderate pain (Must last 30 days.Do not take and drive a car or use machinery.). 100 tablet 0  . naproxen (NAPROSYN) 500 MG tablet Take 1 tablet (500 mg total) by mouth 2 (two) times daily with a meal. 60 tablet 5  . tizanidine (ZANAFLEX) 2 MG capsule Take 1 capsule (2 mg total) by mouth  3 (three) times daily as needed for muscle spasms. 15 capsule 0   No current facility-administered medications for this visit.      Physical Exam  Blood pressure 120/78, pulse 76, temperature 97.5 F (36.4 C), height 6\' 2"  (1.88 m), weight 196 lb (88.9 kg).  Constitutional: overall normal hygiene, normal nutrition, well developed, normal grooming, normal body habitus. Assistive device:none  Musculoskeletal: gait and station Limp none, muscle tone and strength are normal, no tremors or atrophy is present.  .  Neurological: coordination overall normal.  Deep tendon reflex/nerve stretch intact.  Sensation normal.  Cranial nerves II-XII intact.   Skin:   Normal overall no scars, lesions, ulcers or rashes. No psoriasis.  Psychiatric: Alert and oriented x 3.  Recent memory intact, remote memory unclear.  Normal mood and affect. Well groomed.  Good eye contact.  Cardiovascular: overall no swelling, no varicosities, no edema bilaterally, normal temperatures of the legs and arms, no clubbing, cyanosis and good capillary refill.  Lymphatic: palpation is normal.  His neck has full motion but is tender on the right side. He has no tightness or swelling or spasm.  NV intact.  ROM of the shoulders is full.  The patient has been educated about the nature of the problem(s) and counseled on treatment options.  The patient appeared to understand what I have discussed  and is in agreement with it.  Encounter Diagnosis  Name Primary?  . Neck pain Yes    PLAN Call if any problems.  Precautions discussed.  Continue current medications.   Return to clinic 3 months   [place] prior to prescribing narcotic medicine for this patient.  Rx for Voltaren Gel 1% given.  Electronically Signed Darreld McleanWayne Asyia Hornung, MD 3/21/20183:17 PM

## 2017-03-05 ENCOUNTER — Telehealth: Payer: Self-pay | Admitting: Orthopaedic Surgery

## 2017-03-05 NOTE — Telephone Encounter (Signed)
Tammy from [facility] called asking what grams you prescribe for the Voltaren Gel you prescribed for the patient.  Please advise

## 2017-03-06 NOTE — Telephone Encounter (Signed)
Spoke with Tammy and relayed Dr. Sanjuan Dame message.

## 2017-03-06 NOTE — Telephone Encounter (Signed)
Tell them 4 grams per dose, three to four times a day.

## 2017-03-21 ENCOUNTER — Telehealth: Payer: Self-pay | Admitting: Orthopaedic Surgery

## 2017-03-21 MED ORDER — HYDROCODONE-ACETAMINOPHEN 5-325 MG PO TABS: 1.0000 | ORAL_TABLET | Freq: Four times a day (QID) | ORAL | 0 refills | Status: DC | PRN

## 2017-03-21 NOTE — Telephone Encounter (Signed)
Patient called for refill:  HYDROcodone-acetaminophen (NORCO/VICODIN) 5-325 MG tablet 100 tablet   - again discussed online process of requesting through MyChart.

## 2017-04-03 MED FILL — DEXTROAMP-AMP 10 MG TAB: 10 | 30 days supply | Qty: 60 | Fill #0

## 2017-04-18 ENCOUNTER — Telehealth: Payer: Self-pay | Admitting: Orthopaedic Surgery

## 2017-04-18 NOTE — Telephone Encounter (Signed)
Patient requests refill on Hydrocodone/Acetaminophen 5-325  mgs.  Qty  100 ° °Sig: Take 1 tablet by mouth every 6 (six) hours as needed for moderate pain (Must last 30 days.  Do not take and drive a car or use machinery.). °

## 2017-04-18 NOTE — Telephone Encounter (Signed)
Not a [facility] patient?

## 2017-04-22 MED ORDER — HYDROCODONE-ACETAMINOPHEN 5-325 MG PO TABS: 1.0000 | ORAL_TABLET | Freq: Four times a day (QID) | ORAL | 0 refills | Status: DC | PRN

## 2017-05-02 MED FILL — AMPHETAMINE SALTS 10 MG TAB: 10 | 30 days supply | Qty: 60 | Fill #0

## 2017-05-21 ENCOUNTER — Telehealth: Payer: Self-pay | Admitting: Orthopedic Surgery

## 2017-05-21 MED ORDER — HYDROCODONE-ACETAMINOPHEN 5-325 MG PO TABS: 1.0000 | ORAL_TABLET | Freq: Four times a day (QID) | ORAL | 0 refills | Status: DC | PRN

## 2017-05-21 NOTE — Telephone Encounter (Signed)
Patient requested to re-schedule his appointment from today, 05/21/17, and called to re-schedule due to working out of town through the rest of the week.  He is asking if he may have a refill:  HYDROcodone-acetaminophen (NORCO/VICODIN) 5-325 MG tablet 100 tablet

## 2017-05-23 ENCOUNTER — Ambulatory Visit: Payer: 59 | Admitting: Orthopaedic Surgery

## 2017-05-28 ENCOUNTER — Ambulatory Visit: Payer: 59 | Admitting: Orthopaedic Surgery

## 2017-05-30 ENCOUNTER — Ambulatory Visit (INDEPENDENT_AMBULATORY_CARE_PROVIDER_SITE_OTHER): Payer: 59 | Admitting: Orthopaedic Surgery

## 2017-05-30 ENCOUNTER — Encounter: Payer: Self-pay | Admitting: Orthopaedic Surgery

## 2017-05-30 VITALS — BP 128/82 | HR 82 | Temp 97.8°F | Ht 74.0 in | Wt 191.0 lb

## 2017-05-30 DIAGNOSIS — M542 Cervicalgia: Secondary | ICD-10-CM

## 2017-05-30 NOTE — Progress Notes (Signed)
Patient ZO:XWRU:David Mccarthy, male DOB:July 20, 1981, 36 y.o. EAV:[id number]  Chief Complaint  Patient presents with  . Follow-up    NECK PAIN    HPI  David Mccarthy is a 36 y.o. male who has neck pain.  He had a period of increased pain in his neck several weeks ago and felt and heard a pop.  But he is much improved now.  The pain was local and he has no paresthesias.  He is taking his medicine and working daily.   HPI  Body mass index is 24.52 kg/m.  ROS  Review of Systems  HENT: Negative for congestion.   Respiratory: Negative for cough and shortness of breath.   Cardiovascular: Negative for chest pain and leg swelling.  Endocrine: Negative for cold intolerance.  Musculoskeletal: Positive for arthralgias, myalgias and neck pain.  Allergic/Immunologic: Negative for environmental allergies.    Past Medical History:  Diagnosis Date  . ADHD (attention deficit hyperactivity disorder)     Past Surgical History:  Procedure Laterality Date  . INNER EAR SURGERY    . NOSE SURGERY    . TONSILLECTOMY      No family history on file.  Social History Social History  Substance Use Topics  . Smoking status: Never Smoker  . Smokeless tobacco: Never Used  . Alcohol use Yes     Comment: occasional, once every 2 weeks    No Known Allergies  Current Outpatient Prescriptions  Medication Sig Dispense Refill  . amphetamine-dextroamphetamine (ADDERALL) 10 MG tablet Take 10 mg by mouth 2 (two) times daily.    . cyclobenzaprine (FLEXERIL) 10 MG tablet 1/2 to one tab po TID prn pain 60 tablet 4  . HYDROcodone-acetaminophen (NORCO/VICODIN) 5-325 MG tablet Take 1 tablet by mouth every 6 (six) hours as needed for moderate pain (Must last 30 days.Do not take and drive a car or use machinery.). 100 tablet 0  . naproxen (NAPROSYN) 500 MG tablet Take 1 tablet (500 mg total) by mouth 2 (two) times daily with a meal. 60 tablet 5  . tizanidine (ZANAFLEX) 2 MG capsule Take 1 capsule (2 mg total) by  mouth 3 (three) times daily as needed for muscle spasms. 15 capsule 0   No current facility-administered medications for this visit.      Physical Exam  Blood pressure 128/82, pulse 82, temperature 97.8 F (36.6 C), height 6\' 2"  (1.88 m), weight 191 lb (86.6 kg).  Constitutional: overall normal hygiene, normal nutrition, well developed, normal grooming, normal body habitus. Assistive device:none  Musculoskeletal: gait and station Limp none, muscle tone and strength are normal, no tremors or atrophy is present.  .  Neurological: coordination overall normal.  Deep tendon reflex/nerve stretch intact.  Sensation normal.  Cranial nerves II-XII intact.   Skin:   Normal overall no scars, lesions, ulcers or rashes. No psoriasis.  Psychiatric: Alert and oriented x 3.  Recent memory intact, remote memory unclear.  Normal mood and affect. Well groomed.  Good eye contact.  Cardiovascular: overall no swelling, no varicosities, no edema bilaterally, normal temperatures of the legs and arms, no clubbing, cyanosis and good capillary refill.  Lymphatic: palpation is normal.  He has full ROM of his neck. NV intact.  Grips normal.  The patient has been educated about the nature of the problem(s) and counseled on treatment options.  The patient appeared to understand what I have discussed and is in agreement with it.  Encounter Diagnosis  Name Primary?  . Neck pain Yes  PLAN Call if any problems.  Precautions discussed.  Continue current medications.   Return to clinic 3 months   Electronically Signed Darreld Mclean, MD 6/28/20183:33 PM

## 2017-06-03 DIAGNOSIS — E291 Testicular hypofunction: Secondary | ICD-10-CM | POA: Diagnosis not present

## 2017-06-03 DIAGNOSIS — F909 Attention-deficit hyperactivity disorder, unspecified type: Secondary | ICD-10-CM | POA: Diagnosis not present

## 2017-06-04 MED FILL — DEXTROAMP-AMP 10 MG TAB: 10 | 30 days supply | Qty: 60 | Fill #0

## 2017-06-20 ENCOUNTER — Encounter: Payer: Self-pay | Admitting: Orthopaedic Surgery

## 2017-06-24 ENCOUNTER — Telehealth: Payer: Self-pay | Admitting: Orthopaedic Surgery

## 2017-06-24 NOTE — Telephone Encounter (Signed)
Patient called for refill:  HYDROcodone-acetaminophen (NORCO/VICODIN) 5-325 MG tablet 100 tablet

## 2017-06-25 MED ORDER — HYDROCODONE-ACETAMINOPHEN 5-325 MG PO TABS: 1.0000 | ORAL_TABLET | Freq: Four times a day (QID) | ORAL | 0 refills | Status: DC | PRN

## 2017-07-02 DIAGNOSIS — H6122 Impacted cerumen, left ear: Secondary | ICD-10-CM | POA: Diagnosis not present

## 2017-07-08 MED FILL — DEXTROAMP-AMP 10 MG TAB: 10 | 30 days supply | Qty: 60 | Fill #0

## 2017-07-22 ENCOUNTER — Telehealth: Payer: Self-pay | Admitting: Orthopaedic Surgery

## 2017-07-22 NOTE — Telephone Encounter (Signed)
Patient requests refill on Thursday of this week of the Hydrocodone/Acetaminophen 5-325 mgs.   Qty  100  Sig: Take 1 tablet by mouth every 6 (six) hours as needed for moderate pain (Must last 30 days.Do not take and drive a car or use machinery.).

## 2017-07-23 MED ORDER — HYDROCODONE-ACETAMINOPHEN 5-325 MG PO TABS: 1.0000 | ORAL_TABLET | Freq: Four times a day (QID) | ORAL | 0 refills | Status: DC | PRN

## 2017-08-06 MED FILL — DEXTROAMP-AMP 10 MG TAB: 10 | 30 days supply | Qty: 60 | Fill #0

## 2017-08-19 ENCOUNTER — Telehealth: Payer: Self-pay | Admitting: Orthopaedic Surgery
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
## 2017-08-20 MED ORDER — HYDROCODONE-ACETAMINOPHEN 5-325 MG PO TABS: 1.0000 | ORAL_TABLET | Freq: Four times a day (QID) | ORAL | 0 refills | Status: DC | PRN

## 2017-08-27 ENCOUNTER — Ambulatory Visit: Payer: 59 | Admitting: Orthopaedic Surgery

## 2017-08-29 ENCOUNTER — Ambulatory Visit: Payer: 59 | Admitting: Orthopaedic Surgery

## 2017-09-05 ENCOUNTER — Ambulatory Visit (INDEPENDENT_AMBULATORY_CARE_PROVIDER_SITE_OTHER): Payer: 59 | Admitting: Orthopaedic Surgery

## 2017-09-05 ENCOUNTER — Encounter: Payer: Self-pay | Admitting: Orthopaedic Surgery

## 2017-09-05 VITALS — BP 123/72 | HR 66 | Temp 96.4°F | Ht 75.0 in | Wt 190.0 lb

## 2017-09-05 DIAGNOSIS — M542 Cervicalgia: Secondary | ICD-10-CM

## 2017-09-05 MED ORDER — HYDROCODONE-ACETAMINOPHEN 5-325 MG PO TABS: 1.0000 | ORAL_TABLET | Freq: Four times a day (QID) | ORAL | 0 refills | Status: DC | PRN

## 2017-09-05 MED ORDER — CYCLOBENZAPRINE HCL 10 MG PO TABS: ORAL_TABLET | ORAL | 4 refills | Status: DC
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
## 2017-09-06 MED FILL — DEXTROAMP-AMP 10 MG TAB: 10 | 30 days supply | Qty: 60 | Fill #0

## 2017-09-09 MED FILL — CYCLOBENZAPRINE 10 MG TAB: 10 | 20 days supply | Qty: 60 | Fill #0

## 2017-10-03 ENCOUNTER — Telehealth: Payer: Self-pay | Admitting: Orthopaedic Surgery

## 2017-10-03 MED ORDER — HYDROCODONE-ACETAMINOPHEN 5-325 MG PO TABS: 1.0000 | ORAL_TABLET | Freq: Four times a day (QID) | ORAL | 0 refills | Status: DC | PRN

## 2017-10-03 NOTE — Telephone Encounter (Signed)
Patient requests refill on Hydrocodone/Acetaminophen  5-325  Mgs.   Qty  95        Sig: Take 1 tablet by mouth every 6 (six) hours as needed for moderate pain (Must last 30 days.Do not take and drive a car or use machinery.).

## 2017-10-08 MED FILL — DEXTROAMP-AMP 10 MG TAB: 10 | 30 days supply | Qty: 60 | Fill #0

## 2017-11-05 ENCOUNTER — Telehealth: Payer: Self-pay | Admitting: Radiology

## 2017-11-05 MED ORDER — HYDROCODONE-ACETAMINOPHEN 5-325 MG PO TABS: 1.0000 | ORAL_TABLET | Freq: Four times a day (QID) | ORAL | 0 refills | Status: DC | PRN

## 2017-11-05 NOTE — Telephone Encounter (Signed)
Patient called for refill of Hydrocodone 5/325 #95 written on 10/03/17 one q 6hrs / please advise

## 2017-11-08 MED FILL — AMPHETAMINE-DEXTROAMPHETAMI: 10 | 30 days supply | Qty: 60 | Fill #0

## 2017-12-05 ENCOUNTER — Ambulatory Visit: Payer: 59 | Admitting: Orthopaedic Surgery

## 2017-12-11 ENCOUNTER — Ambulatory Visit: Payer: 59 | Admitting: Orthopaedic Surgery

## 2017-12-11 MED FILL — AMPHETAMINE-DEXTROAMPHETAMI: 10 | 30 days supply | Qty: 60 | Fill #0

## 2017-12-12 ENCOUNTER — Encounter: Payer: Self-pay | Admitting: Orthopaedic Surgery

## 2017-12-12 ENCOUNTER — Ambulatory Visit: Payer: 59 | Admitting: Orthopaedic Surgery

## 2017-12-12 VITALS — BP 122/77 | HR 68 | Ht 75.0 in | Wt 200.0 lb

## 2017-12-12 DIAGNOSIS — M542 Cervicalgia: Secondary | ICD-10-CM | POA: Diagnosis not present

## 2017-12-12 MED ORDER — HYDROCODONE-ACETAMINOPHEN 5-325 MG PO TABS: 1.0000 | ORAL_TABLET | Freq: Four times a day (QID) | ORAL | 0 refills | Status: DC | PRN

## 2017-12-12 NOTE — Progress Notes (Signed)
Patient David Mccarthy, male DOB:1981-09-12, 37 y.o. HQI:[id number]  Chief Complaint  Patient presents with  . Neck Pain    HPI  David Mccarthy is a 37 y.o. male who has chronic neck pain.  He has no paresthesias.  He has had increased pain over the last few weeks probably related to weather.  He has no trauma.  He is doing his exercises and taking his medicine. HPI  Body mass index is 25 kg/m.  ROS  Review of Systems  HENT: Negative for congestion.   Respiratory: Negative for cough and shortness of breath.   Cardiovascular: Negative for chest pain and leg swelling.  Endocrine: Negative for cold intolerance.  Musculoskeletal: Positive for arthralgias, myalgias and neck pain.  Allergic/Immunologic: Negative for environmental allergies.  All other systems reviewed and are negative.   Past Medical History:  Diagnosis Date  . ADHD (attention deficit hyperactivity disorder)     Past Surgical History:  Procedure Laterality Date  . INNER EAR SURGERY    . NOSE SURGERY    . TONSILLECTOMY      History reviewed. No pertinent family history.  Social History Social History   Tobacco Use  . Smoking status: Never Smoker  . Smokeless tobacco: Never Used  Substance Use Topics  . Alcohol use: Yes    Comment: occasional, once every 2 weeks  . Drug use: No    No Known Allergies  Current Outpatient Medications  Medication Sig Dispense Refill  . amphetamine-dextroamphetamine (ADDERALL) 10 MG tablet Take 10 mg by mouth 2 (two) times daily.    . cyclobenzaprine (FLEXERIL) 10 MG tablet 1/2 to one tab po TID prn spasm 60 tablet 4  . HYDROcodone-acetaminophen (NORCO/VICODIN) 5-325 MG tablet Take 1 tablet by mouth every 6 (six) hours as needed for moderate pain (Must last 30 days.Do not take and drive a car or use machinery.). 95 tablet 0  . naproxen (NAPROSYN) 500 MG tablet Take 1 tablet (500 mg total) by mouth 2 (two) times daily with a meal. 60 tablet 5   No current  facility-administered medications for this visit.      Physical Exam  Blood pressure 122/77, pulse 68, height 6\' 3"  (1.905 m), weight 200 lb (90.7 kg).  Constitutional: overall normal hygiene, normal nutrition, well developed, normal grooming, normal body habitus. Assistive device:none  Musculoskeletal: gait and station Limp none, muscle tone and strength are normal, no tremors or atrophy is present.  .  Neurological: coordination overall normal.  Deep tendon reflex/nerve stretch intact.  Sensation normal.  Cranial nerves II-XII intact.   Skin:   Normal overall no scars, lesions, ulcers or rashes. No psoriasis.  Psychiatric: Alert and oriented x 3.  Recent memory intact, remote memory unclear.  Normal mood and affect. Well groomed.  Good eye contact.  Cardiovascular: overall no swelling, no varicosities, no edema bilaterally, normal temperatures of the legs and arms, no clubbing, cyanosis and good capillary refill.  Lymphatic: palpation is normal.  He has full ROM of the neck and normal grip and NV status.  All other systems reviewed and are negative   The patient has been educated about the nature of the problem(s) and counseled on treatment options.  The patient appeared to understand what I have discussed and is in agreement with it.  Encounter Diagnosis  Name Primary?  . Neck pain Yes    PLAN Call if any problems.  Precautions discussed.  Continue current medications.   Return in four months   I  have reviewed the [facility] Controlled Substance Reporting System web site prior to prescribing narcotic medicine for this patient.  Electronically Signed Darreld McleanWayne Reisa Coppola, MD 1/10/20198:37 AM

## 2018-01-13 MED FILL — DEXTROAMP-AMP 10 MG TAB: 10 | 30 days supply | Qty: 60 | Fill #0

## 2018-01-14 ENCOUNTER — Telehealth: Payer: Self-pay | Admitting: Orthopaedic Surgery

## 2018-01-14 NOTE — Telephone Encounter (Signed)
Hydrocodone-Acetaminophen  5/325 mg  Qty 95 Tablets   PATIENT STATES HE USES [facility]

## 2018-01-15 MED ORDER — HYDROCODONE-ACETAMINOPHEN 5-325 MG PO TABS: 1.0000 | ORAL_TABLET | Freq: Four times a day (QID) | ORAL | 0 refills | Status: DC | PRN

## 2018-02-10 MED FILL — AMPHETAMINE-DEXTROAMPHETAMI: 10 | 30 days supply | Qty: 60 | Fill #0

## 2018-02-17 ENCOUNTER — Other Ambulatory Visit: Payer: Self-pay | Admitting: Orthopaedic Surgery

## 2018-02-17 MED ORDER — HYDROCODONE-ACETAMINOPHEN 5-325 MG PO TABS: 1.0000 | ORAL_TABLET | Freq: Four times a day (QID) | ORAL | 0 refills | Status: DC | PRN

## 2018-02-17 NOTE — Telephone Encounter (Signed)
Hydrocodone-Acetaminophen  5/325 MG  Qty  95 Tablets  Take 1 tablet by mouth every 6 (six) hours as needed for moderate pain. (Must last 30 days).   Dr. Sanjuan DameKeeling's  PATIENT USES [facility]

## 2018-03-14 MED FILL — AMPHETAMINE SALTS 10 MG TAB: 10 | 30 days supply | Qty: 60 | Fill #0

## 2018-03-24 ENCOUNTER — Telehealth: Payer: Self-pay | Admitting: Orthopaedic Surgery

## 2018-03-24 NOTE — Telephone Encounter (Signed)
Patient of Dr Sanjuan DameKeeling's requests refill on Hydrocodone/Acetaminophen 5-325  Mgs.   Qty  95  Sig: Take 1 tablet by mouth every 6 (six) hours as needed for moderate pain (Must last 30 days).  Patient states he uses [facility]

## 2018-03-25 ENCOUNTER — Other Ambulatory Visit: Payer: Self-pay | Admitting: Orthopedic Surgery

## 2018-03-25 MED ORDER — HYDROCODONE-ACETAMINOPHEN 5-325 MG PO TABS: 1.0000 | ORAL_TABLET | Freq: Four times a day (QID) | ORAL | 0 refills | Status: DC | PRN

## 2018-04-10 ENCOUNTER — Ambulatory Visit: Payer: 59 | Admitting: Orthopaedic Surgery

## 2018-04-11 MED FILL — DEXTROAMP-AMP 10 MG TAB: 10 | 30 days supply | Qty: 60 | Fill #0

## 2018-04-17 ENCOUNTER — Ambulatory Visit: Payer: 59 | Admitting: Orthopaedic Surgery

## 2018-04-17 ENCOUNTER — Encounter: Payer: Self-pay | Admitting: Orthopaedic Surgery

## 2018-04-17 VITALS — BP 126/70 | HR 91 | Ht 74.0 in | Wt 191.0 lb

## 2018-04-17 DIAGNOSIS — M542 Cervicalgia: Secondary | ICD-10-CM

## 2018-04-17 MED ORDER — HYDROCODONE-ACETAMINOPHEN 5-325 MG PO TABS: 1.0000 | ORAL_TABLET | Freq: Four times a day (QID) | ORAL | 0 refills | Status: DC | PRN

## 2018-04-17 NOTE — Progress Notes (Signed)
Patient BM:David Mccarthy, male DOB:02/03/1981, 37 y.o. KGM:[id number]  Chief Complaint  Patient presents with  . Neck Pain    HPI  David JOUNG is a 37 y.o. male who has chronic neck pain.  He had an episode recently of pain in the ulnar nerve distribution for a day or two but that has resolved.  His neck pain is affected more by weather and activity.  He is taking his medicine. HPI  Body mass index is 24.52 kg/m.  ROS  Review of Systems  HENT: Negative for congestion.   Respiratory: Negative for cough and shortness of breath.   Cardiovascular: Negative for chest pain and leg swelling.  Endocrine: Negative for cold intolerance.  Musculoskeletal: Positive for arthralgias, myalgias and neck pain.  Allergic/Immunologic: Negative for environmental allergies.  All other systems reviewed and are negative.   Past Medical History:  Diagnosis Date  . ADHD (attention deficit hyperactivity disorder)     Past Surgical History:  Procedure Laterality Date  . INNER EAR SURGERY    . NOSE SURGERY    . TONSILLECTOMY      History reviewed. No pertinent family history.  Social History Social History   Tobacco Use  . Smoking status: Never Smoker  . Smokeless tobacco: Never Used  Substance Use Topics  . Alcohol use: Yes    Comment: occasional, once every 2 weeks  . Drug use: No    No Known Allergies  Current Outpatient Medications  Medication Sig Dispense Refill  . amphetamine-dextroamphetamine (ADDERALL) 10 MG tablet Take 10 mg by mouth 2 (two) times daily.    . cyclobenzaprine (FLEXERIL) 10 MG tablet 1/2 to one tab po TID prn spasm 60 tablet 4  . HYDROcodone-acetaminophen (NORCO/VICODIN) 5-325 MG tablet Take 1 tablet by mouth every 6 (six) hours as needed for moderate pain (Must last 30 days). 95 tablet 0  . naproxen (NAPROSYN) 500 MG tablet Take 1 tablet (500 mg total) by mouth 2 (two) times daily with a meal. 60 tablet 5   No current facility-administered medications for  this visit.      Physical Exam  Blood pressure 126/70, pulse 91, height  (1.88 m), weight 191 lb (86.6 kg).  Constitutional: overall normal hygiene, normal nutrition, well developed, normal grooming, normal body habitus. Assistive device:none  Musculoskeletal: gait and station Limp none, muscle tone and strength are normal, no tremors or atrophy is present.  .  Neurological: coordination overall normal.  Deep tendon reflex/nerve stretch intact.  Sensation normal.  Cranial nerves II-XII intact.   Skin:   Normal overall no scars, lesions, ulcers or rashes. No psoriasis.  Psychiatric: Alert and oriented x 3.  Recent memory intact, remote memory unclear.  Normal mood and affect. Well groomed.  Good eye contact.  Cardiovascular: overall no swelling, no varicosities, no edema bilaterally, normal temperatures of the legs and arms, no clubbing, cyanosis and good capillary refill.  Lymphatic: palpation is normal.  Neck has full ROM and no crepitus.  NV intact.  All other systems reviewed and are negative   The patient has been educated about the nature of the problem(s) and counseled on treatment options.  The patient appeared to understand what I have discussed and is in agreement with it.  Encounter Diagnosis  Name Primary?  . Neck pain Yes    PLAN Call if any problems.  Precautions discussed.  Continue current medications.   Return to clinic 3 months   I have reviewed the [facility] Controlled Substance  Reporting System web site prior to prescribing narcotic medicine for this patient.  Electronically Signed Darreld Mclean, MD 5/16/20193:49 PM

## 2018-04-29 DIAGNOSIS — Z0001 Encounter for general adult medical examination with abnormal findings: Secondary | ICD-10-CM | POA: Diagnosis not present

## 2018-05-09 DIAGNOSIS — Z0001 Encounter for general adult medical examination with abnormal findings: Secondary | ICD-10-CM | POA: Diagnosis not present

## 2018-05-09 DIAGNOSIS — Z6824 Body mass index (BMI) 24.0-24.9, adult: Secondary | ICD-10-CM | POA: Diagnosis not present

## 2018-05-09 DIAGNOSIS — E291 Testicular hypofunction: Secondary | ICD-10-CM | POA: Diagnosis not present

## 2018-05-12 MED FILL — DEXTROAMP-AMP 10 MG TAB: 10 | 30 days supply | Qty: 60 | Fill #0

## 2018-05-22 ENCOUNTER — Telehealth: Payer: Self-pay | Admitting: Orthopaedic Surgery

## 2018-05-22 MED ORDER — HYDROCODONE-ACETAMINOPHEN 5-325 MG PO TABS: 1.0000 | ORAL_TABLET | Freq: Four times a day (QID) | ORAL | 0 refills | Status: DC | PRN

## 2018-05-22 NOTE — Telephone Encounter (Signed)
Patient requests refill:  HYDROcodone-acetaminophen (NORCO/VICODIN) 5-325 MG tablet 95 tablet   -[facility]   (reminded patient of policy for requests to receive by noon on Thursdays)

## 2018-06-18 ENCOUNTER — Telehealth: Payer: Self-pay | Admitting: Orthopaedic Surgery

## 2018-06-18 MED FILL — AMPHETAMINE-DEXTROAMPHETAMI: 10 | 30 days supply | Qty: 60 | Fill #0

## 2018-06-18 NOTE — Telephone Encounter (Signed)
Patient called and requested a refill on Hydrocodone/Acetaminophen 5-325 mgs.   Qty  95  Sig: Take 1 tablet by mouth every 6 (six) hours as needed for moderate pain (Must last 30 days).  Patient states he uses [facility]

## 2018-06-19 MED ORDER — HYDROCODONE-ACETAMINOPHEN 5-325 MG PO TABS: 1.0000 | ORAL_TABLET | Freq: Four times a day (QID) | ORAL | 0 refills | Status: DC | PRN

## 2018-07-15 ENCOUNTER — Ambulatory Visit: Payer: 59 | Admitting: Orthopaedic Surgery

## 2018-07-15 ENCOUNTER — Encounter: Payer: Self-pay | Admitting: Orthopaedic Surgery

## 2018-07-15 DIAGNOSIS — M542 Cervicalgia: Secondary | ICD-10-CM | POA: Diagnosis not present

## 2018-07-15 NOTE — Progress Notes (Signed)
Patient ON:David Mccarthy, male DOB:01/22/1981, 37 y.o. MWU:[id number]  CC:  My neck is better at times.  HPI  David Mccarthy is a 37 y.o. male who had chronic neck pain. He has pain more with cold weather and with increased activity.  He works [employer].  He has had problems with the ulnar nerve on the right but that appears resolved.  He has no new trauma.  He is taking his medicine.  He has pain more on the right side of the neck.     ROS  Review of Systems  HENT: Negative for congestion.   Respiratory: Negative for cough and shortness of breath.   Cardiovascular: Negative for chest pain and leg swelling.  Endocrine: Negative for cold intolerance.  Musculoskeletal: Positive for arthralgias, myalgias and neck pain.  Allergic/Immunologic: Negative for environmental allergies.  All other systems reviewed and are negative.   All other systems reviewed and are negative.  Past Medical History:  Diagnosis Date  . ADHD (attention deficit hyperactivity disorder)     Past Surgical History:  Procedure Laterality Date  . INNER EAR SURGERY    . NOSE SURGERY    . TONSILLECTOMY      History reviewed. No pertinent family history.  Social History Social History   Tobacco Use  . Smoking status: Never Smoker  . Smokeless tobacco: Never Used  Substance Use Topics  . Alcohol use: Yes    Comment: occasional, once every 2 weeks  . Drug use: No    No Known Allergies  Current Outpatient Medications  Medication Sig Dispense Refill  . amphetamine-dextroamphetamine (ADDERALL) 10 MG tablet Take 10 mg by mouth 2 (two) times daily.    . cyclobenzaprine (FLEXERIL) 10 MG tablet 1/2 to one tab po TID prn spasm 60 tablet 4  . HYDROcodone-acetaminophen (NORCO/VICODIN) 5-325 MG tablet Take 1 tablet by mouth every 6 (six) hours as needed for moderate pain (Must last 30 days). 95 tablet 0  . naproxen (NAPROSYN) 500 MG tablet Take 1 tablet (500 mg total) by mouth 2 (two) times daily with a meal.  60 tablet 5   No current facility-administered medications for this visit.      Physical Exam  6 foot 2 inches, 190 pounds, 116/71, pulse 60, respirations 12.  Constitutional: overall normal hygiene, normal nutrition, well developed, normal grooming, normal body habitus. Assistive device:none  Musculoskeletal: gait and station Limp none, muscle tone and strength are normal, no tremors or atrophy is present.  .  Neurological: coordination overall normal.  Deep tendon reflex/nerve stretch intact.  Sensation normal.  Cranial nerves II-XII intact.   Skin:   Normal overall no scars, lesions, ulcers or rashes. No psoriasis.  Psychiatric: Alert and oriented x 3.  Recent memory intact, remote memory unclear.  Normal mood and affect. Well groomed.  Good eye contact.  Cardiovascular: overall no swelling, no varicosities, no edema bilaterally, normal temperatures of the legs and arms, no clubbing, cyanosis and good capillary refill.  Lymphatic: palpation is normal.  Neck has full ROM.  Reflexes are normal.  All other systems reviewed and are negative   The patient has been educated about the nature of the problem(s) and counseled on treatment options.  The patient appeared to understand what I have discussed and is in agreement with it.  Encounter Diagnosis  Name Primary?  . Neck pain Yes    PLAN Call if any problems.  Precautions discussed.  Continue current medications.   Return to clinic 3 months  Electronically Signed Darreld McleanWayne Girolamo Lortie, MD 8/13/20198:21 AM

## 2018-07-17 ENCOUNTER — Telehealth: Payer: Self-pay | Admitting: Orthopaedic Surgery

## 2018-07-17 ENCOUNTER — Ambulatory Visit: Payer: 59 | Admitting: Orthopaedic Surgery

## 2018-07-17 MED ORDER — HYDROCODONE-ACETAMINOPHEN 5-325 MG PO TABS: 1.0000 | ORAL_TABLET | Freq: Four times a day (QID) | ORAL | 0 refills | Status: DC | PRN

## 2018-07-17 NOTE — Telephone Encounter (Signed)
Patient requests refill: HYDROcodone-acetaminophen (NORCO/VICODIN) 5-325 MG tablet 95 tablet  -[facility]  

## 2018-07-18 MED FILL — AMPHETAMINE-DEXTROAMPHETAMI: 10 | 30 days supply | Qty: 60 | Fill #0

## 2018-08-18 MED FILL — AMPHETAMINE SALTS 10 MG TAB: 10 | 30 days supply | Qty: 60 | Fill #0

## 2018-08-20 ENCOUNTER — Telehealth: Payer: Self-pay | Admitting: Orthopaedic Surgery

## 2018-08-20 NOTE — Telephone Encounter (Signed)
Patient called for refill:  HYDROcodone-acetaminophen (NORCO/VICODIN) 5-325 MG tablet 1 tablet Oral Every 6 hours PRN 20 mg MEDD  -[facility]

## 2018-08-21 MED ORDER — HYDROCODONE-ACETAMINOPHEN 5-325 MG PO TABS: 1.0000 | ORAL_TABLET | Freq: Four times a day (QID) | ORAL | 0 refills | Status: DC | PRN

## 2018-09-16 ENCOUNTER — Telehealth: Payer: Self-pay | Admitting: Orthopaedic Surgery

## 2018-09-16 MED ORDER — HYDROCODONE-ACETAMINOPHEN 5-325 MG PO TABS: 1.0000 | ORAL_TABLET | Freq: Four times a day (QID) | ORAL | 0 refills | Status: DC | PRN

## 2018-09-16 NOTE — Telephone Encounter (Signed)
Patient called for refill:  HYDROcodone-acetaminophen (NORCO/VICODIN) 5-325 MG tablet 95 tablet  [facility]  

## 2018-09-17 MED FILL — AMPHETAMINE-DEXTROAMPHETAMI: 10 | 30 days supply | Qty: 60 | Fill #0

## 2018-10-14 ENCOUNTER — Ambulatory Visit: Payer: 59 | Admitting: Orthopaedic Surgery

## 2018-10-14 ENCOUNTER — Encounter: Payer: Self-pay | Admitting: Orthopaedic Surgery

## 2018-10-14 VITALS — BP 138/83 | HR 89 | Ht 73.0 in | Wt 195.0 lb

## 2018-10-14 DIAGNOSIS — M542 Cervicalgia: Secondary | ICD-10-CM | POA: Diagnosis not present

## 2018-10-14 NOTE — Progress Notes (Signed)
Patient ZO:XWRU David Mccarthy, male DOB:1981/03/21, 37 y.o. EAV:[id number]  Chief Complaint  Patient presents with  . Neck Pain    Neck pain feeling about the same    HPI  David Mccarthy is a 37 y.o. male who continues to have neck pain.  It is worse on cold rainy days like today.  He has no paresthesias, no injury.  He has no further ulnar nerve symptoms.  He is taking his medicine.   Body mass index is 25.73 kg/m.  ROS  Review of Systems  HENT: Negative for congestion.   Respiratory: Negative for cough and shortness of breath.   Cardiovascular: Negative for chest pain and leg swelling.  Endocrine: Negative for cold intolerance.  Musculoskeletal: Positive for arthralgias, myalgias and neck pain.  Allergic/Immunologic: Negative for environmental allergies.  All other systems reviewed and are negative.   All other systems reviewed and are negative.  The following is a summary of the past history medically, past history surgically, known current medicines, social history and family history.  This information is gathered electronically by the computer from prior information and documentation.  I review this each visit and have found including this information at this point in the chart is beneficial and informative.    Past Medical History:  Diagnosis Date  . ADHD (attention deficit hyperactivity disorder)     Past Surgical History:  Procedure Laterality Date  . INNER EAR SURGERY    . NOSE SURGERY    . TONSILLECTOMY      History reviewed. No pertinent family history.  Social History Social History   Tobacco Use  . Smoking status: Never Smoker  . Smokeless tobacco: Never Used  Substance Use Topics  . Alcohol use: Yes    Comment: occasional, once every 2 weeks  . Drug use: No    No Known Allergies  Current Outpatient Medications  Medication Sig Dispense Refill  . amphetamine-dextroamphetamine (ADDERALL) 10 MG tablet Take 10 mg by mouth 2 (two) times daily.    .  cyclobenzaprine (FLEXERIL) 10 MG tablet 1/2 to one tab po TID prn spasm 60 tablet 4  . HYDROcodone-acetaminophen (NORCO/VICODIN) 5-325 MG tablet Take 1 tablet by mouth every 6 (six) hours as needed for moderate pain (Must last 30 days). 95 tablet 0  . naproxen (NAPROSYN) 500 MG tablet Take 1 tablet (500 mg total) by mouth 2 (two) times daily with a meal. 60 tablet 5   No current facility-administered medications for this visit.      Physical Exam  Blood pressure 138/83, pulse 89, height 6\' 1"  (1.854 m), weight 195 lb (88.5 kg).  Constitutional: overall normal hygiene, normal nutrition, well developed, normal grooming, normal body habitus. Assistive device:none  Musculoskeletal: gait and station Limp none, muscle tone and strength are normal, no tremors or atrophy is present.  .  Neurological: coordination overall normal.  Deep tendon reflex/nerve stretch intact.  Sensation normal.  Cranial nerves II-XII intact.   Skin:   Normal overall no scars, lesions, ulcers or rashes. No psoriasis.  Psychiatric: Alert and oriented x 3.  Recent memory intact, remote memory unclear.  Normal mood and affect. Well groomed.  Good eye contact.  Cardiovascular: overall no swelling, no varicosities, no edema bilaterally, normal temperatures of the legs and arms, no clubbing, cyanosis and good capillary refill.  Lymphatic: palpation is normal.  He has full ROM of the neck.   All other systems reviewed and are negative   The patient has been educated about the  nature of the problem(s) and counseled on treatment options.  The patient appeared to understand what I have discussed and is in agreement with it.  Encounter Diagnosis  Name Primary?  . Neck pain Yes    PLAN Call if any problems.  Precautions discussed.  Continue current medications.   Return to clinic 3 months   Electronically Signed David McleanWayne Annett Boxwell, MD 11/12/20191:56 PM

## 2018-10-15 ENCOUNTER — Ambulatory Visit: Payer: 59 | Admitting: Orthopaedic Surgery

## 2018-10-16 MED FILL — AMPHETAMINE-DEXTROAMPHETAMI: 10 | 30 days supply | Qty: 60 | Fill #0

## 2018-10-20 ENCOUNTER — Telehealth: Payer: Self-pay | Admitting: Orthopaedic Surgery

## 2018-10-20 DIAGNOSIS — H73892 Other specified disorders of tympanic membrane, left ear: Secondary | ICD-10-CM | POA: Diagnosis not present

## 2018-10-20 DIAGNOSIS — H9012 Conductive hearing loss, unilateral, left ear, with unrestricted hearing on the contralateral side: Secondary | ICD-10-CM | POA: Diagnosis not present

## 2018-10-20 DIAGNOSIS — H7192 Unspecified cholesteatoma, left ear: Secondary | ICD-10-CM | POA: Diagnosis not present

## 2018-10-20 DIAGNOSIS — H9193 Unspecified hearing loss, bilateral: Secondary | ICD-10-CM | POA: Diagnosis not present

## 2018-10-20 MED ORDER — HYDROCODONE-ACETAMINOPHEN 5-325 MG PO TABS: 1.0000 | ORAL_TABLET | Freq: Four times a day (QID) | ORAL | 0 refills | Status: DC | PRN

## 2018-10-20 NOTE — Telephone Encounter (Signed)
Patient called, requests refill:  HYDROcodone-acetaminophen (NORCO/VICODIN) 5-325 MG tablet 95 tablet   -[facility]

## 2018-11-17 MED FILL — AMPHETAMINE-DEXTROAMPHETAMI: 10 | 30 days supply | Qty: 60 | Fill #0

## 2018-11-18 ENCOUNTER — Telehealth: Payer: Self-pay | Admitting: Orthopaedic Surgery

## 2018-11-18 NOTE — Telephone Encounter (Signed)
Patient requests refill on Hydrocodone/Acetaminophen 5-325  Mgs.  Qty  95  Sig: Take 1 tablet by mouth every 6 (six) hours as needed for moderate pain (Must last 30 days).         Patient states he uses [facility]

## 2018-11-19 MED ORDER — HYDROCODONE-ACETAMINOPHEN 5-325 MG PO TABS: 1.0000 | ORAL_TABLET | Freq: Four times a day (QID) | ORAL | 0 refills | Status: DC | PRN

## 2018-11-21 DIAGNOSIS — F909 Attention-deficit hyperactivity disorder, unspecified type: Secondary | ICD-10-CM | POA: Diagnosis not present

## 2018-11-21 DIAGNOSIS — Z23 Encounter for immunization: Secondary | ICD-10-CM | POA: Diagnosis not present

## 2018-12-15 MED FILL — AMPHETAMINE-DEXTROAMPHETAMI: 10 | 30 days supply | Qty: 60 | Fill #0

## 2018-12-17 ENCOUNTER — Telehealth: Payer: Self-pay | Admitting: Orthopaedic Surgery

## 2018-12-17 NOTE — Telephone Encounter (Signed)
Patient requests refill on Hydrocodone/Acetaminophen 5-325 mgs. Qty 95  Sig:Take 1 tablet by mouth every 6 (six) hours as needed for moderate pain (Must last 30 days).  Patient uses [facility] 

## 2018-12-18 MED ORDER — HYDROCODONE-ACETAMINOPHEN 5-325 MG PO TABS: 1.0000 | ORAL_TABLET | Freq: Four times a day (QID) | ORAL | 0 refills | Status: DC | PRN

## 2019-01-14 ENCOUNTER — Ambulatory Visit: Payer: 59 | Admitting: Orthopaedic Surgery

## 2019-01-16 MED FILL — AMPHETAMINE-DEXTROAMPHETAMI: 10 | 30 days supply | Qty: 60 | Fill #0

## 2019-01-19 ENCOUNTER — Telehealth: Payer: Self-pay | Admitting: Orthopaedic Surgery

## 2019-01-19 NOTE — Telephone Encounter (Signed)
Patient called for refill:  HYDROcodone-acetaminophen (NORCO/VICODIN) 5-325 MG tablet 95 tablet    - Pharmacy is [place]    - patient had to re-schedule his appointment from last week to this Thursday, 01/22/2019, due to work

## 2019-01-20 MED ORDER — HYDROCODONE-ACETAMINOPHEN 5-325 MG PO TABS: 1.0000 | ORAL_TABLET | Freq: Four times a day (QID) | ORAL | 0 refills | Status: DC | PRN

## 2019-01-22 ENCOUNTER — Ambulatory Visit: Payer: No Typology Code available for payment source | Admitting: Orthopaedic Surgery

## 2019-02-16 MED FILL — AMPHETAMINE-DEXTROAMPHETAMI: 10 | 30 days supply | Qty: 60 | Fill #0

## 2019-02-19 ENCOUNTER — Encounter: Payer: Self-pay | Admitting: Orthopaedic Surgery

## 2019-02-19 ENCOUNTER — Other Ambulatory Visit: Payer: Self-pay

## 2019-02-19 ENCOUNTER — Ambulatory Visit: Payer: No Typology Code available for payment source | Admitting: Orthopaedic Surgery

## 2019-02-19 VITALS — BP 122/80 | HR 61 | Ht 73.0 in | Wt 195.0 lb

## 2019-02-19 DIAGNOSIS — M542 Cervicalgia: Secondary | ICD-10-CM

## 2019-02-19 MED ORDER — HYDROCODONE-ACETAMINOPHEN 5-325 MG PO TABS: 1.0000 | ORAL_TABLET | Freq: Four times a day (QID) | ORAL | 0 refills | Status: DC | PRN

## 2019-02-19 NOTE — Progress Notes (Signed)
Patient David Mccarthy, male DOB:10-28-1981, 38 y.o. IZT:[id number]  Chief Complaint  Patient presents with  . Neck Pain    HPI  David Mccarthy is a 38 y.o. male who has chronic neck pain. He has good and bad days.  He has no new trauma.  He has no paresthesias.  He is taking his medicine.    Body mass index is 25.73 kg/m.  ROS  Review of Systems  HENT: Negative for congestion.   Respiratory: Negative for cough and shortness of breath.   Cardiovascular: Negative for chest pain and leg swelling.  Endocrine: Negative for cold intolerance.  Musculoskeletal: Positive for arthralgias, myalgias and neck pain.  Allergic/Immunologic: Negative for environmental allergies.  All other systems reviewed and are negative.   All other systems reviewed and are negative.  The following is a summary of the past history medically, past history surgically, known current medicines, social history and family history.  This information is gathered electronically by the computer from prior information and documentation.  I review this each visit and have found including this information at this point in the chart is beneficial and informative.    Past Medical History:  Diagnosis Date  . ADHD (attention deficit hyperactivity disorder)     Past Surgical History:  Procedure Laterality Date  . INNER EAR SURGERY    . NOSE SURGERY    . TONSILLECTOMY      History reviewed. No pertinent family history.  Social History Social History   Tobacco Use  . Smoking status: Never Smoker  . Smokeless tobacco: Never Used  Substance Use Topics  . Alcohol use: Yes    Comment: occasional, once every 2 weeks  . Drug use: No    No Known Allergies  Current Outpatient Medications  Medication Sig Dispense Refill  . amphetamine-dextroamphetamine (ADDERALL) 10 MG tablet Take 10 mg by mouth 2 (two) times daily.    . cyclobenzaprine (FLEXERIL) 10 MG tablet 1/2 to one tab po TID prn spasm 60 tablet 4  .  HYDROcodone-acetaminophen (NORCO/VICODIN) 5-325 MG tablet Take 1 tablet by mouth every 6 (six) hours as needed for moderate pain (Must last 30 days). 95 tablet 0  . naproxen (NAPROSYN) 500 MG tablet Take 1 tablet (500 mg total) by mouth 2 (two) times daily with a meal. 60 tablet 5   No current facility-administered medications for this visit.      Physical Exam  Blood pressure 122/80, pulse 61, height 6\' 1"  (1.854 m), weight 195 lb (88.5 kg).  Constitutional: overall normal hygiene, normal nutrition, well developed, normal grooming, normal body habitus. Assistive device:none  Musculoskeletal: gait and station Limp none, muscle tone and strength are normal, no tremors or atrophy is present.  .  Neurological: coordination overall normal.  Deep tendon reflex/nerve stretch intact.  Sensation normal.  Cranial nerves II-XII intact.   Skin:   Normal overall no scars, lesions, ulcers or rashes. No psoriasis.  Psychiatric: Alert and oriented x 3.  Recent memory intact, remote memory unclear.  Normal mood and affect. Well groomed.  Good eye contact.  Cardiovascular: overall no swelling, no varicosities, no edema bilaterally, normal temperatures of the legs and arms, no clubbing, cyanosis and good capillary refill.  Lymphatic: palpation is normal.  Neck has full ROM.  Grips are normal.  NV intact.  All other systems reviewed and are negative   The patient has been educated about the nature of the problem(s) and counseled on treatment options.  The patient appeared to  understand what I have discussed and is in agreement with it.  Encounter Diagnosis  Name Primary?  . Neck pain Yes    PLAN Call if any problems.  Precautions discussed.  Continue current medications.   Return to clinic 3 months   I have reviewed the [facility] Controlled Substance Reporting System web site prior to prescribing narcotic medicine for this patient.   The patient has read and signed an Opioid Treatment  Agreement which has been scanned and added to the medical record.  The patient understands the agreement and agrees to abide with it.  The patient has chronic pain that is being treated with an opioid which relieves the pain.  The patient understands potential complications with chronic opioid treatment.   Electronically Signed Darreld Mclean, MD 3/19/20208:35 AM

## 2019-03-19 ENCOUNTER — Other Ambulatory Visit: Payer: Self-pay | Admitting: Radiology

## 2019-03-19 NOTE — Telephone Encounter (Signed)
Patient called, left message on voice mail that he would like a refill of his hydrocodone.

## 2019-03-23 MED ORDER — HYDROCODONE-ACETAMINOPHEN 5-325 MG PO TABS: 1.0000 | ORAL_TABLET | Freq: Four times a day (QID) | ORAL | 0 refills | Status: DC | PRN

## 2019-03-23 NOTE — Addendum Note (Signed)
Addended by: Earnstine Regal on: 03/23/2019 08:43 PM   Modules accepted: Orders

## 2019-03-23 NOTE — Addendum Note (Signed)
Addended by: Baird Kay on: 03/23/2019 08:39 AM   Modules accepted: Orders

## 2019-03-24 MED FILL — AMPHETAMINE-DEXTROAMPHETAMI: 10 | 30 days supply | Qty: 60 | Fill #0

## 2019-04-21 MED FILL — AMPHETAMINE-DEXTROAMPHETAMI: 10 | 30 days supply | Qty: 60 | Fill #0

## 2019-04-22 ENCOUNTER — Telehealth: Payer: Self-pay | Admitting: Orthopaedic Surgery

## 2019-04-22 NOTE — Telephone Encounter (Signed)
Continued ([facility] system closed/shut down at time of previous note) Patient requests refill:   HYDROcodone-acetaminophen (NORCO/VICODIN) 5-325 MG tablet  - [facility]

## 2019-04-22 NOTE — Telephone Encounter (Signed)
Patient called for refill: HYDROcodone-acetaminophen (NORCO/VICODIN) 5-325 MG tablet 1 tablet   -

## 2019-04-23 MED ORDER — HYDROCODONE-ACETAMINOPHEN 5-325 MG PO TABS: 1.0000 | ORAL_TABLET | Freq: Four times a day (QID) | ORAL | 0 refills | Status: DC | PRN

## 2019-05-18 DIAGNOSIS — G8929 Other chronic pain: Secondary | ICD-10-CM | POA: Diagnosis not present

## 2019-05-18 DIAGNOSIS — E291 Testicular hypofunction: Secondary | ICD-10-CM | POA: Diagnosis not present

## 2019-05-18 DIAGNOSIS — F909 Attention-deficit hyperactivity disorder, unspecified type: Secondary | ICD-10-CM | POA: Diagnosis not present

## 2019-05-19 MED FILL — AMPHETAMINE-DEXTROAMPHETAMI: 10 | 30 days supply | Qty: 60 | Fill #0

## 2019-05-21 ENCOUNTER — Ambulatory Visit: Payer: Self-pay | Admitting: Orthopaedic Surgery

## 2019-05-26 ENCOUNTER — Ambulatory Visit: Payer: No Typology Code available for payment source | Admitting: Orthopaedic Surgery

## 2019-05-26 ENCOUNTER — Other Ambulatory Visit: Payer: Self-pay

## 2019-05-26 ENCOUNTER — Encounter: Payer: Self-pay | Admitting: Orthopaedic Surgery

## 2019-05-26 VITALS — BP 128/83 | HR 77 | Temp 97.9°F | Ht 73.0 in | Wt 178.0 lb

## 2019-05-26 DIAGNOSIS — M542 Cervicalgia: Secondary | ICD-10-CM | POA: Diagnosis not present

## 2019-05-26 MED ORDER — HYDROCODONE-ACETAMINOPHEN 5-325 MG PO TABS: 1.0000 | ORAL_TABLET | Freq: Four times a day (QID) | ORAL | 0 refills | Status: DC | PRN

## 2019-05-26 NOTE — Progress Notes (Signed)
Patient ZO:XWRU:David Mccarthy, male DOB:1980-12-12, 38 y.o. [id number]  Chief Complaint  Patient presents with  . Neck Pain  . Medication Refill    hydrocodone     HPI  David Mccarthy is a 38 y.o. male who has continued intermittent neck pain.  He has good and bad days depending on his activity and the weather.  He has no new trauma.  He has no numbness.  He is taking his medicine.   Body mass index is 23.48 kg/m.  ROS  Review of Systems  HENT: Negative for congestion.   Respiratory: Negative for cough and shortness of breath.   Cardiovascular: Negative for chest pain and leg swelling.  Endocrine: Negative for cold intolerance.  Musculoskeletal: Positive for arthralgias, myalgias and neck pain.  Allergic/Immunologic: Negative for environmental allergies.  All other systems reviewed and are negative.   All other systems reviewed and are negative.  The following is a summary of the past history medically, past history surgically, known current medicines, social history and family history.  This information is gathered electronically by the computer from prior information and documentation.  I review this each visit and have found including this information at this point in the chart is beneficial and informative.    Past Medical History:  Diagnosis Date  . ADHD (attention deficit hyperactivity disorder)     Past Surgical History:  Procedure Laterality Date  . INNER EAR SURGERY    . NOSE SURGERY    . TONSILLECTOMY      History reviewed. No pertinent family history.  Social History Social History   Tobacco Use  . Smoking status: Never Smoker  . Smokeless tobacco: Never Used  Substance Use Topics  . Alcohol use: Yes    Comment: occasional, once every 2 weeks  . Drug use: No    No Known Allergies  Current Outpatient Medications  Medication Sig Dispense Refill  . amphetamine-dextroamphetamine (ADDERALL) 10 MG tablet Take 10 mg by mouth 2 (two) times daily.    .  cyclobenzaprine (FLEXERIL) 10 MG tablet 1/2 to one tab po TID prn spasm 60 tablet 4  . HYDROcodone-acetaminophen (NORCO/VICODIN) 5-325 MG tablet Take 1 tablet by mouth every 6 (six) hours as needed for moderate pain (Must last 30 days). 95 tablet 0  . naproxen (NAPROSYN) 500 MG tablet Take 1 tablet (500 mg total) by mouth 2 (two) times daily with a meal. 60 tablet 5   No current facility-administered medications for this visit.      Physical Exam  Blood pressure 128/83, pulse 77, temperature 97.9 F (36.6 C), height 6\' 1"  (1.854 m), weight 178 lb (80.7 kg).  Constitutional: overall normal hygiene, normal nutrition, well developed, normal grooming, normal body habitus. Assistive device:none  Musculoskeletal: gait and station Limp none, muscle tone and strength are normal, no tremors or atrophy is present.  .  Neurological: coordination overall normal.  Deep tendon reflex/nerve stretch intact.  Sensation normal.  Cranial nerves II-XII intact.   Skin:   Normal overall no scars, lesions, ulcers or rashes. No psoriasis.  Psychiatric: Alert and oriented x 3.  Recent memory intact, remote memory unclear.  Normal mood and affect. Well groomed.  Good eye contact.  Cardiovascular: overall no swelling, no varicosities, no edema bilaterally, normal temperatures of the legs and arms, no clubbing, cyanosis and good capillary refill.  Lymphatic: palpation is normal.  Neck has full ROM and no spasm.  NV intact.  All other systems reviewed and are negative  The patient has been educated about the nature of the problem(s) and counseled on treatment options.  The patient appeared to understand what I have discussed and is in agreement with it.  Encounter Diagnosis  Name Primary?  . Neck pain Yes    PLAN Call if any problems.  Precautions discussed.  Continue current medications.   Return to clinic 3 months   I have reviewed the [place] web site  prior to prescribing narcotic medicine for this patient.   Electronically Signed Sanjuana Kava, MD 6/23/20208:36 AM

## 2019-06-18 MED FILL — AMPHETAMINE-DEXTROAMPHETAMI: 10 | 30 days supply | Qty: 60 | Fill #0

## 2019-06-23 ENCOUNTER — Telehealth: Payer: Self-pay | Admitting: Orthopaedic Surgery

## 2019-06-23 MED ORDER — HYDROCODONE-ACETAMINOPHEN 5-325 MG PO TABS: 1.0000 | ORAL_TABLET | Freq: Four times a day (QID) | ORAL | 0 refills | Status: DC | PRN

## 2019-06-23 NOTE — Telephone Encounter (Signed)
Patient called for refill: HYDROcodone-acetaminophen (NORCO/VICODIN) 5-325 MG tablet 95 tablet 0  -[facility]

## 2019-07-17 MED FILL — AMPHETAMINE-DEXTROAMPHETAMI: 10 | 30 days supply | Qty: 60 | Fill #0

## 2019-07-23 ENCOUNTER — Telehealth: Payer: Self-pay | Admitting: Orthopaedic Surgery

## 2019-07-23 NOTE — Telephone Encounter (Signed)
Patient of Dr. Brooke Bonito requests refill on Hydrocodonoe/Acetaminophen 5-325  Mgs.   Qty  95       Sig: Take 1 tablet by mouth every 6 (six) hours as needed for moderate pain (Must last 30 days).     Patient states he uses [facility]

## 2019-07-28 ENCOUNTER — Telehealth: Payer: Self-pay | Admitting: Orthopedic Surgery

## 2019-08-03 ENCOUNTER — Telehealth: Payer: Self-pay

## 2019-08-03 MED ORDER — HYDROCODONE-ACETAMINOPHEN 5-325 MG PO TABS: 1.0000 | ORAL_TABLET | Freq: Four times a day (QID) | ORAL | 0 refills | Status: DC | PRN

## 2019-08-03 NOTE — Telephone Encounter (Signed)
Hydrocodone-Acetaminophen 5/325 mg  Qty 95 Tablets  [facility]

## 2019-08-18 ENCOUNTER — Encounter: Payer: Self-pay | Admitting: Orthopaedic Surgery

## 2019-08-18 ENCOUNTER — Other Ambulatory Visit: Payer: Self-pay

## 2019-08-18 ENCOUNTER — Ambulatory Visit: Payer: No Typology Code available for payment source | Admitting: Orthopaedic Surgery

## 2019-08-18 VITALS — BP 114/74 | HR 63 | Ht 73.0 in | Wt 178.0 lb

## 2019-08-18 DIAGNOSIS — M542 Cervicalgia: Secondary | ICD-10-CM

## 2019-08-18 MED ORDER — CYCLOBENZAPRINE HCL 10 MG PO TABS: ORAL_TABLET | ORAL | 4 refills | Status: DC

## 2019-08-18 MED ORDER — IBUPROFEN 600 MG PO TABS: 600.0000 mg | ORAL_TABLET | Freq: Four times a day (QID) | ORAL | 5 refills | Status: DC | PRN

## 2019-08-18 MED FILL — AMPHETAMINE-DEXTROAMPHETAMI: 10 | 30 days supply | Qty: 60 | Fill #0

## 2019-08-18 NOTE — Progress Notes (Signed)
Patient ON:GEXB:David Mccarthy, male DOB:1981-01-31, 38 y.o. MWU:[id number]  Chief Complaint  Patient presents with  . Neck Pain    HPI  David Mccarthy is a 38 y.o. male who has chronic neck pain.  He has more pain with a long day at work.  He has no paresthesias.  He is out of his ibuprofen and Flexeril.  I will refill.  He has no new trauma.   Body mass index is 23.48 kg/m.  ROS  Review of Systems  HENT: Negative for congestion.   Respiratory: Negative for cough and shortness of breath.   Cardiovascular: Negative for chest pain and leg swelling.  Endocrine: Negative for cold intolerance.  Musculoskeletal: Positive for arthralgias, myalgias and neck pain.  Allergic/Immunologic: Negative for environmental allergies.  All other systems reviewed and are negative.   All other systems reviewed and are negative.  The following is a summary of the past history medically, past history surgically, known current medicines, social history and family history.  This information is gathered electronically by the computer from prior information and documentation.  I review this each visit and have found including this information at this point in the chart is beneficial and informative.    Past Medical History:  Diagnosis Date  . ADHD (attention deficit hyperactivity disorder)     Past Surgical History:  Procedure Laterality Date  . INNER EAR SURGERY    . NOSE SURGERY    . TONSILLECTOMY      History reviewed. No pertinent family history.  Social History Social History   Tobacco Use  . Smoking status: Never Smoker  . Smokeless tobacco: Never Used  Substance Use Topics  . Alcohol use: Yes    Comment: occasional, once every 2 weeks  . Drug use: No    No Known Allergies  Current Outpatient Medications  Medication Sig Dispense Refill  . amphetamine-dextroamphetamine (ADDERALL) 10 MG tablet Take 10 mg by mouth 2 (two) times daily.    . cyclobenzaprine (FLEXERIL) 10 MG tablet 1/2 to  one tab po TID prn spasm 60 tablet 4  . HYDROcodone-acetaminophen (NORCO/VICODIN) 5-325 MG tablet Take 1 tablet by mouth every 6 (six) hours as needed for moderate pain (Must last 30 days). 95 tablet 0  . ibuprofen (ADVIL) 600 MG tablet Take 1 tablet (600 mg total) by mouth every 6 (six) hours as needed. 100 tablet 5   No current facility-administered medications for this visit.      Physical Exam  Blood pressure 114/74, pulse 63, height 6\' 1"  (1.854 m), weight 178 lb (80.7 kg).  Constitutional: overall normal hygiene, normal nutrition, well developed, normal grooming, normal body habitus. Assistive device:none  Musculoskeletal: gait and station Limp none, muscle tone and strength are normal, no tremors or atrophy is present.  .  Neurological: coordination overall normal.  Deep tendon reflex/nerve stretch intact.  Sensation normal.  Cranial nerves II-XII intact.   Skin:   Normal overall no scars, lesions, ulcers or rashes. No psoriasis.  Psychiatric: Alert and oriented x 3.  Recent memory intact, remote memory unclear.  Normal mood and affect. Well groomed.  Good eye contact.  Cardiovascular: overall no swelling, no varicosities, no edema bilaterally, normal temperatures of the legs and arms, no clubbing, cyanosis and good capillary refill.  Lymphatic: palpation is normal.  Neck has full ROM.  All other systems reviewed and are negative   The patient has been educated about the nature of the problem(s) and counseled on treatment options.  The  patient appeared to understand what I have discussed and is in agreement with it.  Encounter Diagnosis  Name Primary?  . Neck pain Yes    PLAN Call if any problems.  Precautions discussed.  Continue current medications.   Return to clinic 3 months   Electronically Signed Sanjuana Kava, MD 9/15/20208:27 AM

## 2019-08-24 ENCOUNTER — Telehealth: Payer: Self-pay | Admitting: Orthopaedic Surgery

## 2019-08-24 NOTE — Telephone Encounter (Signed)
Patient called and stated that next week while you are gone, he would need a refill on his Hydrocodone/Acetaminophen 5-325 mgs.  Qty  95.  He wanted to know if this could be sent to [facility] now and they put it on hold until it is due.  According to [facility], the last time you were out of town and his medication was due, Dr. Aline Brochure would not do it for him.  I told him that I did not know but that I would send the message on to you.

## 2019-08-25 ENCOUNTER — Other Ambulatory Visit: Payer: Self-pay | Admitting: Orthopaedic Surgery

## 2019-08-25 MED ORDER — HYDROCODONE-ACETAMINOPHEN 5-325 MG PO TABS: 1.0000 | ORAL_TABLET | Freq: Four times a day (QID) | ORAL | 0 refills | Status: DC | PRN

## 2019-08-25 NOTE — Telephone Encounter (Signed)
While pt was here in the office, you stated that you would refill his Ibuprofen and Flexeril.  The Ibuprofen was filled but it appears that the Flexeril prescription was printed first but never sent electronically to [facility].    Refill would be Flexeril 10 mgs.  Qty 60 with 4 refills  Thanks

## 2019-08-26 MED ORDER — CYCLOBENZAPRINE HCL 10 MG PO TABS: ORAL_TABLET | ORAL | 4 refills | Status: DC

## 2019-09-17 MED FILL — AMPHETAMINE-DEXTROAMPHETAMI: 10 | 30 days supply | Qty: 60 | Fill #0

## 2019-10-01 ENCOUNTER — Telehealth: Payer: Self-pay

## 2019-10-01 NOTE — Telephone Encounter (Signed)
Hydrocodone-Acetaminophen  5/325mg   Qty 95 Tablets  PATIENT USES [facility]

## 2019-10-06 MED ORDER — HYDROCODONE-ACETAMINOPHEN 5-325 MG PO TABS: 1.0000 | ORAL_TABLET | Freq: Four times a day (QID) | ORAL | 0 refills | Status: DC | PRN

## 2019-10-20 MED FILL — AMPHETAMINE-DEXTROAMPHETAMI: 10 | 30 days supply | Qty: 60 | Fill #0

## 2019-11-04 ENCOUNTER — Telehealth: Payer: Self-pay | Admitting: Orthopaedic Surgery

## 2019-11-04 MED ORDER — HYDROCODONE-ACETAMINOPHEN 5-325 MG PO TABS: 1.0000 | ORAL_TABLET | Freq: Four times a day (QID) | ORAL | 0 refills | Status: DC | PRN

## 2019-11-04 NOTE — Telephone Encounter (Signed)
Patient requests refill: HYDROcodone-acetaminophen (NORCO/VICODIN) 5-325 MG tablet 95 tablet  Pharmacy: [facility]

## 2019-11-05 ENCOUNTER — Ambulatory Visit: Payer: No Typology Code available for payment source | Admitting: Orthopaedic Surgery

## 2019-11-17 ENCOUNTER — Ambulatory Visit: Payer: No Typology Code available for payment source | Admitting: Orthopaedic Surgery

## 2019-11-19 MED FILL — AMPHETAMINE-DEXTROAMPHETAMI: 10 | 30 days supply | Qty: 60 | Fill #0

## 2019-11-24 ENCOUNTER — Ambulatory Visit: Payer: No Typology Code available for payment source | Admitting: Orthopaedic Surgery

## 2019-12-03 ENCOUNTER — Encounter: Payer: Self-pay | Admitting: Orthopaedic Surgery

## 2019-12-03 ENCOUNTER — Other Ambulatory Visit: Payer: Self-pay | Admitting: Orthopaedic Surgery

## 2019-12-03 ENCOUNTER — Ambulatory Visit: Payer: No Typology Code available for payment source | Admitting: Orthopaedic Surgery

## 2019-12-03 ENCOUNTER — Ambulatory Visit: Payer: No Typology Code available for payment source

## 2019-12-03 ENCOUNTER — Other Ambulatory Visit: Payer: Self-pay

## 2019-12-03 VITALS — Ht 73.0 in | Wt 192.0 lb

## 2019-12-03 DIAGNOSIS — M79642 Pain in left hand: Secondary | ICD-10-CM

## 2019-12-03 DIAGNOSIS — M542 Cervicalgia: Secondary | ICD-10-CM

## 2019-12-03 MED ORDER — HYDROCODONE-ACETAMINOPHEN 5-325 MG PO TABS: 1.0000 | ORAL_TABLET | Freq: Four times a day (QID) | ORAL | 0 refills | Status: DC | PRN

## 2019-12-03 NOTE — Progress Notes (Signed)
Patient KT:GYBW [name], male DOB:[birth year], 37 y.o. [id number]:[id number]  Chief Complaint  Patient presents with  . Neck Pain    Pain still the same  . Hand Pain    left/ injury with machete in October     HPI  SEVILLE DOWNS is a 38 y.o. male who has chronic neck pain.  It is stable.  He has good and bad days.  He has been active and has no paresthesias.  He is taking his medicine.  On 09-18-2019 he cut his left nondominant hand over the third dorsal metacarpal area with a machete and had an open wound.  He used super glue to close it.  It was deep he says.  He has pain now on the ulnar side of the third metacarpal head area but no numbness or weakness.  He has no redness.  He has some chronic swelling or a lump in the area.   Body mass index is 25.33 kg/m.  ROS  Review of Systems  HENT: Negative for congestion.   Respiratory: Negative for cough and shortness of breath.   Cardiovascular: Negative for chest pain and leg swelling.  Endocrine: Negative for cold intolerance.  Musculoskeletal: Positive for arthralgias, myalgias and neck pain.  Allergic/Immunologic: Negative for environmental allergies.  All other systems reviewed and are negative.   All other systems reviewed and are negative.  The following is a summary of the past history medically, past history surgically, known current medicines, social history and family history.  This information is gathered electronically by the computer from prior information and documentation.  I review this each visit and have found including this information at this point in the chart is beneficial and informative.    Past Medical History:  Diagnosis Date  . ADHD (attention deficit hyperactivity disorder)     Past Surgical History:  Procedure Laterality Date  . INNER EAR SURGERY    . NOSE SURGERY    . TONSILLECTOMY      Family history of heart disease.  Social History Social History   Tobacco Use  . Smoking status: Never  Smoker  . Smokeless tobacco: Never Used  Substance Use Topics  . Alcohol use: Yes    Comment: occasional, once every 2 weeks  . Drug use: No    No Known Allergies  Current Outpatient Medications  Medication Sig Dispense Refill  . amphetamine-dextroamphetamine (ADDERALL) 10 MG tablet Take 10 mg by mouth 2 (two) times daily.    . cyclobenzaprine (FLEXERIL) 10 MG tablet 1/2 to one tab po TID prn spasm 40 tablet 4  . HYDROcodone-acetaminophen (NORCO/VICODIN) 5-325 MG tablet Take 1 tablet by mouth every 6 (six) hours as needed for moderate pain (Must last 30 days). 95 tablet 0  . ibuprofen (ADVIL) 600 MG tablet Take 1 tablet (600 mg total) by mouth every 6 (six) hours as needed. 100 tablet 5   No current facility-administered medications for this visit.     Physical Exam  Height 6\' 1"  (1.854 m), weight 192 lb (87.1 kg). Pulse 68, respirations 12.  Constitutional: overall normal hygiene, normal nutrition, well developed, normal grooming, normal body habitus. Assistive device:none  Musculoskeletal: gait and station Limp none, muscle tone and strength are normal, no tremors or atrophy is present.  .  Neurological: coordination overall normal.  Deep tendon reflex/nerve stretch intact.  Sensation normal.  Cranial nerves II-XII intact.   Skin:   Normal overall no scars, lesions, ulcers or rashes. No psoriasis.  Psychiatric: Alert and  oriented x 3.  Recent memory intact, remote memory unclear.  Normal mood and affect. Well groomed.  Good eye contact.  Cardiovascular: overall no swelling, no varicosities, no edema bilaterally, normal temperatures of the legs and arms, no clubbing, cyanosis and good capillary refill.  Lymphatic: palpation is normal.  Neck has full ROM.  NV intact.  Left long finger has well healed scar over the third metacarpal area with full ROM and no pain and normal NV status.  He has a firm area on the ulnar side of the third metacarpal distally.  There is no  redness.  All other systems reviewed and are negative   The patient has been educated about the nature of the problem(s) and counseled on treatment options.  The patient appeared to understand what I have discussed and is in agreement with it.  Encounter Diagnoses  Name Primary?  . Pain in left hand Yes  . Neck pain    X-rays were done of the left hand, reported separately.  PLAN Call if any problems.  Precautions discussed.  Continue current medications.   Return to clinic 3 months   I have reviewed the [place] web site prior to prescribing narcotic medicine for this patient.   Electronically Signed Sanjuana Kava, MD 12/31/20209:08 AM

## 2019-12-03 NOTE — Patient Instructions (Signed)
Use Aspercreme, Biofreeze or Voltaren gel over the counter 2-3 times daily make sure you rub it in well each time you use it.   

## 2019-12-22 MED FILL — AMPHETAMINE-DEXTROAMPHETAMI: 10 | 30 days supply | Qty: 60 | Fill #0

## 2020-01-06 ENCOUNTER — Telehealth: Payer: Self-pay | Admitting: Orthopaedic Surgery

## 2020-01-06 ENCOUNTER — Telehealth: Payer: Self-pay | Admitting: Orthopedic Surgery

## 2020-01-06 MED ORDER — HYDROCODONE-ACETAMINOPHEN 5-325 MG PO TABS: 1.0000 | ORAL_TABLET | Freq: Four times a day (QID) | ORAL | 0 refills | Status: DC | PRN

## 2020-01-06 NOTE — Telephone Encounter (Signed)
Patient requests refill: HYDROcodone-acetaminophen (NORCO/VICODIN) 5-325 MG tablet 95 tablet  -[facility] Opened in error / chose our other provider, Dr Harrison/encounter is to be opened and sent to Dr Hilda Lias - please disregard this encounter

## 2020-01-06 NOTE — Telephone Encounter (Signed)
Patient called for refill:  HYDROcodone-acetaminophen (NORCO/VICODIN) 5-325 MG tablet 95 tablet  [facility]  

## 2020-01-22 MED FILL — AMPHETAMINE-DEXTROAMPHETAMI: 10 | 30 days supply | Qty: 60 | Fill #0

## 2020-02-02 ENCOUNTER — Other Ambulatory Visit: Payer: Self-pay | Admitting: Radiology

## 2020-02-02 MED ORDER — HYDROCODONE-ACETAMINOPHEN 5-325 MG PO TABS: 1.0000 | ORAL_TABLET | Freq: Four times a day (QID) | ORAL | 0 refills | Status: DC | PRN

## 2020-02-22 MED FILL — AMPHETAMINE-DEXTROAMPHETAMI: 10 | 30 days supply | Qty: 60 | Fill #0

## 2020-03-01 ENCOUNTER — Other Ambulatory Visit: Payer: Self-pay

## 2020-03-01 ENCOUNTER — Ambulatory Visit (INDEPENDENT_AMBULATORY_CARE_PROVIDER_SITE_OTHER): Payer: No Typology Code available for payment source | Admitting: Orthopaedic Surgery

## 2020-03-01 ENCOUNTER — Encounter: Payer: Self-pay | Admitting: Orthopaedic Surgery

## 2020-03-01 VITALS — BP 126/84 | HR 69 | Ht 76.0 in | Wt 195.0 lb

## 2020-03-01 DIAGNOSIS — M542 Cervicalgia: Secondary | ICD-10-CM

## 2020-03-01 MED ORDER — HYDROCODONE-ACETAMINOPHEN 5-325 MG PO TABS: 1.0000 | ORAL_TABLET | Freq: Four times a day (QID) | ORAL | 0 refills | Status: DC | PRN

## 2020-03-01 NOTE — Progress Notes (Signed)
Patient David Mccarthy, male DOB:August 07, 1981, 39 y.o. KZS:[id number]  Chief Complaint  Patient presents with  . Neck Pain  . Medication Refill    HPI  David Mccarthy is a 39 y.o. male who has chronic neck pain. He had episode of increased pain about two weeks ago for no apparent reason.  It has gone away now.  He has no paresthesias.  He is taking his medicine.   Body mass index is 23.74 kg/m.  ROS  Review of Systems  HENT: Negative for congestion.   Respiratory: Negative for cough and shortness of breath.   Cardiovascular: Negative for chest pain and leg swelling.  Endocrine: Negative for cold intolerance.  Musculoskeletal: Positive for arthralgias, myalgias and neck pain.  Allergic/Immunologic: Negative for environmental allergies.  All other systems reviewed and are negative.   All other systems reviewed and are negative.  The following is a summary of the past history medically, past history surgically, known current medicines, social history and family history.  This information is gathered electronically by the computer from prior information and documentation.  I review this each visit and have found including this information at this point in the chart is beneficial and informative.    Past Medical History:  Diagnosis Date  . ADHD (attention deficit hyperactivity disorder)     Past Surgical History:  Procedure Laterality Date  . INNER EAR SURGERY    . NOSE SURGERY    . TONSILLECTOMY      History reviewed. No pertinent family history.  Social History Social History   Tobacco Use  . Smoking status: Never Smoker  . Smokeless tobacco: Never Used  Substance Use Topics  . Alcohol use: Yes    Comment: occasional, once every 2 weeks  . Drug use: No    No Known Allergies  Current Outpatient Medications  Medication Sig Dispense Refill  . amphetamine-dextroamphetamine (ADDERALL) 10 MG tablet Take 10 mg by mouth 2 (two) times daily.    . cyclobenzaprine  (FLEXERIL) 10 MG tablet 1/2 to one tab po TID prn spasm 40 tablet 4  . HYDROcodone-acetaminophen (NORCO/VICODIN) 5-325 MG tablet Take 1 tablet by mouth every 6 (six) hours as needed for moderate pain (Must last 30 days). 95 tablet 0  . ibuprofen (ADVIL) 600 MG tablet Take 1 tablet (600 mg total) by mouth every 6 (six) hours as needed. 100 tablet 5   No current facility-administered medications for this visit.     Physical Exam  Blood pressure 126/84, pulse 69, height 6\' 4"  (1.93 m), weight 195 lb (88.5 kg).  Constitutional: overall normal hygiene, normal nutrition, well developed, normal grooming, normal body habitus. Assistive device:none  Musculoskeletal: gait and station Limp none, muscle tone and strength are normal, no tremors or atrophy is present.  .  Neurological: coordination overall normal.  Deep tendon reflex/nerve stretch intact.  Sensation normal.  Cranial nerves II-XII intact.   Skin:   Normal overall no scars, lesions, ulcers or rashes. No psoriasis.  Psychiatric: Alert and oriented x 3.  Recent memory intact, remote memory unclear.  Normal mood and affect. Well groomed.  Good eye contact.  Cardiovascular: overall no swelling, no varicosities, no edema bilaterally, normal temperatures of the legs and arms, no clubbing, cyanosis and good capillary refill.  Lymphatic: palpation is normal.  Neck has full ROM today, no spasm, grips normal, NV intact.  All other systems reviewed and are negative   The patient has been educated about the nature of the problem(s) and  counseled on treatment options.  The patient appeared to understand what I have discussed and is in agreement with it.  Encounter Diagnosis  Name Primary?  . Neck pain Yes    PLAN Call if any problems.  Precautions discussed.  Continue current medications.   Return to clinic 3 months   I have reviewed the [place] web site prior to prescribing narcotic  medicine for this patient.   Electronically Signed Sanjuana Kava, MD 3/30/20218:38 AM

## 2020-03-23 MED FILL — AMPHETAMINE-DEXTROAMPHETAMI: 10 | 30 days supply | Qty: 60 | Fill #0

## 2020-03-31 ENCOUNTER — Telehealth: Payer: Self-pay | Admitting: Orthopaedic Surgery

## 2020-03-31 MED ORDER — HYDROCODONE-ACETAMINOPHEN 5-325 MG PO TABS: 1.0000 | ORAL_TABLET | Freq: Four times a day (QID) | ORAL | 0 refills | Status: DC | PRN

## 2020-03-31 NOTE — Telephone Encounter (Signed)
Patient requests refill on Hydrocodone/Actaminophen 5-325  Mgs.  Qty  95  Sig: Take 1 tablet by mouth every 6 (six) hours as needed for moderate pain (Must last 30 days).  Patient states he uses [facility] 

## 2020-04-12 NOTE — Telephone Encounter (Signed)
error 

## 2020-04-21 MED FILL — AMPHETAMINE-DEXTROAMPHETAMI: 10 | 30 days supply | Qty: 60 | Fill #0

## 2020-05-05 ENCOUNTER — Telehealth: Payer: Self-pay | Admitting: Orthopaedic Surgery

## 2020-05-05 MED ORDER — HYDROCODONE-ACETAMINOPHEN 5-325 MG PO TABS: 1.0000 | ORAL_TABLET | Freq: Four times a day (QID) | ORAL | 0 refills | Status: DC | PRN

## 2020-05-05 NOTE — Addendum Note (Signed)
Addended by: Earnstine Regal on: 05/05/2020 10:50 AM   Modules accepted: Orders

## 2020-05-05 NOTE — Telephone Encounter (Signed)
Patient requests refill: HYDROcodone-acetaminophen (NORCO/VICODIN) 5-325 MG tablet 95 tablet  -[facility]

## 2020-05-19 MED FILL — AMPHETAMINE-DEXTROAMPHETAMI: 10 | 30 days supply | Qty: 60 | Fill #0

## 2020-05-31 ENCOUNTER — Encounter: Payer: Self-pay | Admitting: Orthopaedic Surgery

## 2020-05-31 ENCOUNTER — Other Ambulatory Visit: Payer: Self-pay

## 2020-05-31 ENCOUNTER — Ambulatory Visit: Payer: No Typology Code available for payment source | Admitting: Orthopaedic Surgery

## 2020-05-31 VITALS — BP 125/86 | HR 72 | Ht 74.0 in | Wt 193.2 lb

## 2020-05-31 DIAGNOSIS — M542 Cervicalgia: Secondary | ICD-10-CM

## 2020-05-31 MED ORDER — HYDROCODONE-ACETAMINOPHEN 5-325 MG PO TABS: 1.0000 | ORAL_TABLET | Freq: Four times a day (QID) | ORAL | 0 refills | Status: DC | PRN

## 2020-05-31 NOTE — Progress Notes (Signed)
Patient David Mccarthy, male DOB:February 16, 1981, 39 y.o. JJO:[id number]  Chief Complaint  Patient presents with  . Neck Pain    hurting some this morning; but its ok    HPI  David Mccarthy is a 39 y.o. male who has chronic neck pain.  He has more pain today as he lifted 48 doors for his work yesterday.  He works in [employer].  He had no paresthesias.  He is taking his medicine.   Body mass index is 24.81 kg/m.  ROS  Review of Systems  HENT: Negative for congestion.   Respiratory: Negative for cough and shortness of breath.   Cardiovascular: Negative for chest pain and leg swelling.  Endocrine: Negative for cold intolerance.  Musculoskeletal: Positive for arthralgias, myalgias and neck pain.  Allergic/Immunologic: Negative for environmental allergies.  All other systems reviewed and are negative.   All other systems reviewed and are negative.  The following is a summary of the past history medically, past history surgically, known current medicines, social history and family history.  This information is gathered electronically by the computer from prior information and documentation.  I review this each visit and have found including this information at this point in the chart is beneficial and informative.    Past Medical History:  Diagnosis Date  . ADHD (attention deficit hyperactivity disorder)     Past Surgical History:  Procedure Laterality Date  . INNER EAR SURGERY    . NOSE SURGERY    . TONSILLECTOMY      History reviewed. No pertinent family history.  Social History Social History   Tobacco Use  . Smoking status: Never Smoker  . Smokeless tobacco: Never Used  Substance Use Topics  . Alcohol use: Yes    Comment: occasional, once every 2 weeks  . Drug use: No    No Known Allergies  Current Outpatient Medications  Medication Sig Dispense Refill  . amphetamine-dextroamphetamine (ADDERALL) 10 MG tablet Take 10 mg by mouth 2 (two) times daily.    .  cyclobenzaprine (FLEXERIL) 10 MG tablet 1/2 to one tab po TID prn spasm 40 tablet 4  . HYDROcodone-acetaminophen (NORCO/VICODIN) 5-325 MG tablet Take 1 tablet by mouth every 6 (six) hours as needed for moderate pain (Must last 30 days). 95 tablet 0  . ibuprofen (ADVIL) 600 MG tablet Take 1 tablet (600 mg total) by mouth every 6 (six) hours as needed. 100 tablet 5   No current facility-administered medications for this visit.     Physical Exam  Blood pressure 125/86, pulse 72, height 6\' 2"  (1.88 m), weight 193 lb 4 oz (87.7 kg).  Constitutional: overall normal hygiene, normal nutrition, well developed, normal grooming, normal body habitus. Assistive device:none  Musculoskeletal: gait and station Limp none, muscle tone and strength are normal, no tremors or atrophy is present.  .  Neurological: coordination overall normal.  Deep tendon reflex/nerve stretch intact.  Sensation normal.  Cranial nerves II-XII intact.   Skin:   Normal overall no scars, lesions, ulcers or rashes. No psoriasis.  Psychiatric: Alert and oriented x 3.  Recent memory intact, remote memory unclear.  Normal mood and affect. Well groomed.  Good eye contact.  Cardiovascular: overall no swelling, no varicosities, no edema bilaterally, normal temperatures of the legs and arms, no clubbing, cyanosis and good capillary refill.  Lymphatic: palpation is normal.  Neck has full ROM. NV intact.  All other systems reviewed and are negative   The patient has been educated about the nature of  the problem(s) and counseled on treatment options.  The patient appeared to understand what I have discussed and is in agreement with it.  Encounter Diagnosis  Name Primary?  . Neck pain Yes    PLAN Call if any problems.  Precautions discussed.  Continue current medications.   Return to clinic 3 months   I have reviewed the [facility] Controlled Substance Reporting System web site prior to prescribing narcotic medicine for  this patient.   Electronically Signed Darreld Mclean, MD 6/29/20218:21 AM

## 2020-06-21 MED FILL — AMPHETAMINE SALTS 10 MG: 10 | 30 days supply | Qty: 60 | Fill #0

## 2020-06-30 ENCOUNTER — Telehealth: Payer: Self-pay | Admitting: Orthopaedic Surgery

## 2020-06-30 NOTE — Telephone Encounter (Signed)
Patient called for refill: HYDROcodone-acetaminophen (NORCO/VICODIN) 5-325 MG tablet 95 tablet  [facility]  -Patient aware requests are to be received before noon on Thursdays; said was unable to call.

## 2020-07-04 ENCOUNTER — Other Ambulatory Visit: Payer: Self-pay | Admitting: Orthopaedic Surgery

## 2020-07-04 MED ORDER — HYDROCODONE-ACETAMINOPHEN 5-325 MG PO TABS: 1.0000 | ORAL_TABLET | Freq: Four times a day (QID) | ORAL | 0 refills | Status: DC | PRN

## 2020-07-20 MED FILL — AMPHETAMINE SALTS 10 MG: 10 | 30 days supply | Qty: 60 | Fill #0

## 2020-08-02 ENCOUNTER — Telehealth: Payer: Self-pay | Admitting: Orthopaedic Surgery

## 2020-08-02 NOTE — Telephone Encounter (Signed)
Patient requests refill on Hydrocodone/Actaminophen 5-325  Mgs.  Qty  95  Sig: Take 1 tablet by mouth every 6 (six) hours as needed for moderate pain (Must last 30 days).  Patient states he uses [facility]

## 2020-08-03 MED ORDER — HYDROCODONE-ACETAMINOPHEN 5-325 MG PO TABS: 1.0000 | ORAL_TABLET | Freq: Four times a day (QID) | ORAL | 0 refills | Status: DC | PRN

## 2020-08-19 DIAGNOSIS — M722 Plantar fascial fibromatosis: Secondary | ICD-10-CM | POA: Insufficient documentation

## 2020-08-22 MED FILL — AMPHETAMINE SALTS 10 MG: 10 | 30 days supply | Qty: 60 | Fill #0

## 2020-08-30 ENCOUNTER — Encounter: Payer: Self-pay | Admitting: Orthopaedic Surgery

## 2020-08-30 ENCOUNTER — Ambulatory Visit: Payer: No Typology Code available for payment source | Admitting: Orthopaedic Surgery

## 2020-08-30 ENCOUNTER — Other Ambulatory Visit: Payer: Self-pay

## 2020-08-30 VITALS — BP 123/82 | HR 78 | Ht 74.0 in | Wt 190.0 lb

## 2020-08-30 DIAGNOSIS — M542 Cervicalgia: Secondary | ICD-10-CM

## 2020-08-30 MED ORDER — HYDROCODONE-ACETAMINOPHEN 5-325 MG PO TABS: 1.0000 | ORAL_TABLET | Freq: Four times a day (QID) | ORAL | 0 refills | Status: DC | PRN

## 2020-08-30 NOTE — Progress Notes (Signed)
Patient David Mccarthy, male DOB:11/16/1981, 39 y.o. GLO:[id number]  Chief Complaint  Patient presents with   Neck Pain    HPI  David Mccarthy is a 39 y.o. male who has chronic neck pain.  He has good and bad days.  He works in [employer].  He has been doing his exercises and taking his medicine.  He has no new trauma, no paresthesias.   Body mass index is 24.39 kg/m.  ROS  Review of Systems  HENT: Negative for congestion.   Respiratory: Negative for cough and shortness of breath.   Cardiovascular: Negative for chest pain and leg swelling.  Endocrine: Negative for cold intolerance.  Musculoskeletal: Positive for arthralgias, myalgias and neck pain.  Allergic/Immunologic: Negative for environmental allergies.  All other systems reviewed and are negative.   All other systems reviewed and are negative.  The following is a summary of the past history medically, past history surgically, known current medicines, social history and family history.  This information is gathered electronically by the computer from prior information and documentation.  I review this each visit and have found including this information at this point in the chart is beneficial and informative.    Past Medical History:  Diagnosis Date   ADHD (attention deficit hyperactivity disorder)     Past Surgical History:  Procedure Laterality Date   INNER EAR SURGERY     NOSE SURGERY     TONSILLECTOMY      History reviewed. No pertinent family history.  Social History Social History   Tobacco Use   Smoking status: Never Smoker   Smokeless tobacco: Never Used  Substance Use Topics   Alcohol use: Yes    Comment: occasional, once every 2 weeks   Drug use: No    No Known Allergies  Current Outpatient Medications  Medication Sig Dispense Refill   amphetamine-dextroamphetamine (ADDERALL) 10 MG tablet Take 10 mg by mouth 2 (two) times daily.     cyclobenzaprine (FLEXERIL) 10 MG tablet 1/2  to one tab po TID prn spasm 40 tablet 4   HYDROcodone-acetaminophen (NORCO/VICODIN) 5-325 MG tablet Take 1 tablet by mouth every 6 (six) hours as needed for moderate pain (Must last 30 days). 95 tablet 0   ibuprofen (ADVIL) 600 MG tablet Take 1 tablet (600 mg total) by mouth every 6 (six) hours as needed. 100 tablet 5   No current facility-administered medications for this visit.     Physical Exam  Blood pressure 123/82, pulse 78, height 6\' 2"  (1.88 m), weight 190 lb (86.2 kg).  Constitutional: overall normal hygiene, normal nutrition, well developed, normal grooming, normal body habitus. Assistive device:none  Musculoskeletal: gait and station Limp none, muscle tone and strength are normal, no tremors or atrophy is present.  .  Neurological: coordination overall normal.  Deep tendon reflex/nerve stretch intact.  Sensation normal.  Cranial nerves II-XII intact.   Skin:   Normal overall no scars, lesions, ulcers or rashes. No psoriasis.  Psychiatric: Alert and oriented x 3.  Recent memory intact, remote memory unclear.  Normal mood and affect. Well groomed.  Good eye contact.  Cardiovascular: overall no swelling, no varicosities, no edema bilaterally, normal temperatures of the legs and arms, no clubbing, cyanosis and good capillary refill.  Lymphatic: palpation is normal.  All other systems reviewed and are negative   The patient has been educated about the nature of the problem(s) and counseled on treatment options.  The patient appeared to understand what I have discussed and is  in agreement with it.  Encounter Diagnosis  Name Primary?   Neck pain Yes    PLAN Call if any problems.  Precautions discussed.  Continue current medications.   Return to clinic 3 months   I have reviewed the [facility] Controlled Substance Reporting System web site prior to prescribing narcotic medicine for this patient.   Electronically Signed Darreld Mclean, MD 9/28/20218:54 AM

## 2020-09-19 ENCOUNTER — Other Ambulatory Visit (HOSPITAL_COMMUNITY): Payer: Self-pay | Admitting: Internal Medicine

## 2020-09-19 MED FILL — AMPHETAMINE SALTS 10 MG: 10 | 30 days supply | Qty: 60 | Fill #0

## 2020-09-29 ENCOUNTER — Telehealth: Payer: Self-pay | Admitting: Orthopaedic Surgery

## 2020-09-29 NOTE — Telephone Encounter (Signed)
Patient requests refill on Hydrocodone/Acetaminophen 5-325 mgs. Qty 95  Sig:Take 1 tablet by mouth every 6 (six) hours as needed for moderate pain (Must last 30 days).  Patient uses [facility] 

## 2020-09-30 MED ORDER — HYDROCODONE-ACETAMINOPHEN 5-325 MG PO TABS: 1.0000 | ORAL_TABLET | Freq: Four times a day (QID) | ORAL | 0 refills | Status: DC | PRN

## 2020-10-18 ENCOUNTER — Other Ambulatory Visit: Payer: Self-pay

## 2020-10-18 ENCOUNTER — Other Ambulatory Visit: Payer: No Typology Code available for payment source

## 2020-10-18 DIAGNOSIS — Z20822 Contact with and (suspected) exposure to covid-19: Secondary | ICD-10-CM

## 2020-10-19 ENCOUNTER — Other Ambulatory Visit: Payer: Self-pay

## 2020-10-19 ENCOUNTER — Encounter: Payer: Self-pay | Admitting: Urology

## 2020-10-19 ENCOUNTER — Ambulatory Visit (INDEPENDENT_AMBULATORY_CARE_PROVIDER_SITE_OTHER): Payer: No Typology Code available for payment source | Admitting: Urology

## 2020-10-19 ENCOUNTER — Ambulatory Visit: Payer: Self-pay | Admitting: Urology

## 2020-10-19 VITALS — BP 131/82 | HR 82 | Temp 99.3°F | Ht 73.0 in | Wt 190.0 lb

## 2020-10-19 DIAGNOSIS — Z3009 Encounter for other general counseling and advice on contraception: Secondary | ICD-10-CM | POA: Diagnosis not present

## 2020-10-19 LAB — SARS-COV-2, NAA 2 DAY TAT

## 2020-10-19 LAB — NOVEL CORONAVIRUS, NAA: SARS-CoV-2, NAA: NOT DETECTED

## 2020-10-19 MED ORDER — DIAZEPAM 10 MG PO TABS: 10.0000 mg | ORAL_TABLET | Freq: Once | ORAL | 0 refills | Status: AC

## 2020-10-19 NOTE — Progress Notes (Signed)
10/19/2020 1:30 PM   David Mccarthy 1981/04/19 [id number]  Referring provider: Carylon Perches, MD [address] [place],  [place] [address]  Desires sterilization  HPI: David Mccarthy is a 39yo here for consideration of elective sterilization. NO prior scrotal surgeries. NO hx of testicular trauma. No ED. He and his wife to not desires any more children.   PMH: Past Medical History:  Diagnosis Date  . ADHD (attention deficit hyperactivity disorder)     Surgical History: Past Surgical History:  Procedure Laterality Date  . INNER EAR SURGERY    . NOSE SURGERY    . TONSILLECTOMY      Home Medications:  Allergies as of 10/19/2020   No Known Allergies     Medication List       Accurate as of October 19, 2020  1:30 PM. If you have any questions, ask your nurse or doctor.        STOP taking these medications   ibuprofen 600 MG tablet Commonly known as: ADVIL Stopped by: Wilkie Aye, MD     TAKE these medications   amphetamine-dextroamphetamine 10 MG tablet Commonly known as: ADDERALL Take 10 mg by mouth 2 (two) times daily.   cyclobenzaprine 10 MG tablet Commonly known as: FLEXERIL 1/2 to one tab po TID prn spasm   HYDROcodone-acetaminophen 5-325 MG tablet Commonly known as: NORCO/VICODIN Take 1 tablet by mouth every 6 (six) hours as needed for moderate pain (Must last 30 days).       Allergies: No Known Allergies  Family History: History reviewed. No pertinent family history.  Social History:  reports that he has never smoked. He has never used smokeless tobacco. He reports current alcohol use. He reports that he does not use drugs.  ROS: All other review of systems were reviewed and are negative except what is noted above in HPI  Physical Exam: BP 131/82   Pulse 82   Temp 99.3 F (37.4 C)   Ht 6\' 1"  (1.854 m)   Wt 190 lb (86.2 kg)   BMI 25.07 kg/m   Constitutional:  Alert and oriented, No acute distress. HEENT: [place] AT, moist mucus  membranes.  Trachea midline, no masses. Cardiovascular: No clubbing, cyanosis, or edema. Respiratory: Normal respiratory effort, no increased work of breathing. GI: Abdomen is soft, nontender, nondistended, no abdominal masses GU: No CVA tenderness. Circumcised phallus. No masses/lesions on penis, testis, scrotum. Prostate 40g smooth no nodules no induration.  Lymph: No cervical or inguinal lymphadenopathy. Skin: No rashes, bruises or suspicious lesions. Neurologic: Grossly intact, no focal deficits, moving all 4 extremities. Psychiatric: Normal mood and affect.  Laboratory Data: No results found for: WBC, HGB, HCT, MCV, PLT  No results found for: CREATININE  No results found for: PSA  No results found for: TESTOSTERONE  No results found for: HGBA1C  Urinalysis No results found for: COLORURINE, APPEARANCEUR, LABSPEC, PHURINE, GLUCOSEU, HGBUR, BILIRUBINUR, KETONESUR, PROTEINUR, UROBILINOGEN, NITRITE, LEUKOCYTESUR  No results found for: LABMICR, WBCUA, RBCUA, LABEPIT, MUCUS, BACTERIA  Pertinent Imaging:  No results found for this or any previous visit.  No results found for this or any previous visit.  No results found for this or any previous visit.  No results found for this or any previous visit.  No results found for this or any previous visit.  No results found for this or any previous visit.  No results found for this or any previous visit.  No results found for this or any previous visit.   Assessment &  Plan:    1. Sterilization consult -rx for valium   No follow-ups on file.  Wilkie Aye, MD  [facility] [place]

## 2020-10-19 NOTE — Patient Instructions (Signed)
Vasectomy Vasectomy is a procedure in which the tube that carries sperm from the testicle to the urethra (vas deferens) is tied. It may also be cut. The procedure blocks sperm from going through the vas deferens and penis during ejaculation. This ensures that sperm does not go into the vagina during sex. Vasectomy does not affect your sexual desire or performance, and does not prevent sexually transmitted diseases. Vasectomy is considered a permanent and very effective form of birth control (contraception). The decision to have a vasectomy should not be made during a stressful situation, such as after the loss of a pregnancy or a divorce. You and your partner should make the decision to have a vasectomy when you are sure that you do not want children in the future. Tell a health care provider about:  Any allergies you have.  All medicines you are taking, including vitamins, herbs, eye drops, creams, and over-the-counter medicines.  Any problems you or family members have had with anesthetic medicines.  Any blood disorders you have.  Any surgeries you have had.  Any medical conditions you have. What are the risks? Generally, this is a safe procedure. However, problems may occur, including:  Infection.  Bleeding and swelling of the scrotum.  Allergic reactions to medicines.  Failure of the procedure to prevent pregnancy. There is a very small chance that the cut ends of the vas deferens may reconnect (recanalization), meaning that you could still make a woman pregnant.  Pain in the scrotum that continues after healing from the procedure. What happens before the procedure?  Ask your health care provider about: ? Changing or stopping your regular medicines. This is especially important if you are taking diabetes medicines or blood thinners. ? Taking over-the-counter medicines, vitamins, herbs, and supplements. ? Taking medicines such as aspirin and ibuprofen. These medicines can thin  your blood. Do not take these medicines unless your health care provider tells you to take them.  You may be asked to shower with a germ-killing soap.  Plan to have someone take you home from the hospital or clinic. What happens during the procedure?   To lower your risk of infection: ? Your health care team will wash or sanitize their hands. ? Hair may be removed from the surgical area. ? Your scrotum will be washed with soap.  You will be given one or more of the following: ? A medicine to help you relax (sedative). You may be instructed to take this a few hours before the procedure. ? A medicine to numb the area (local anesthetic).  Your health care provider will feel (palpate) for your vas deferens.  To reach the vas deferens, one of two methods may be used: ? A very small incision may be made in your scrotum. ? A punctured opening may be made in your scrotum, without an incision.  Your vas deferens will be pulled out of your scrotum, and may be: ? Tied off. ? Cut and possibly burned (cauterized) at the ends to seal them off.  The vas deferens will be put back into your scrotum.  The incision or puncture opening will be closed with absorbable stitches (sutures). The sutures will eventually dissolve and will not need to be removed after the procedure. The procedure may vary among health care providers and hospitals. What happens after the procedure?  You will be monitored to make sure that you do not experience problems.  You will be asked not to ejaculate for at least 1 week after   the procedure, or as long as directed.  You will need to use a different form of contraception for 2-4 months after the procedure, until you have test results confirming that there are no sperm in your semen.  You may be given scrotal support to wear, such as a jock strap or underwear with a supportive pouch.  Do not drive for 24 hours if you were given a sedative to help you  relax. Summary  Vasectomy is considered a permanent and very effective form of birth control (contraception). The procedure prevents sperm from being released during ejaculation.  Your scrotum will be numbed with medicine (local anesthetic) for the procedure.  After the procedure, you will be asked not to ejaculate for at least 1 week, or for as long as directed. You will also need to use a different form of contraception until your health care provider examines you and finds that there are no sperm in your semen. This information is not intended to replace advice given to you by your health care provider. Make sure you discuss any questions you have with your health care provider. Document Revised: 05/23/2018 Document Reviewed: 02/15/2017 Elsevier Patient Education  2020 [employer] Inc[employer]  

## 2020-10-19 NOTE — Progress Notes (Signed)

## 2020-10-25 ENCOUNTER — Other Ambulatory Visit (HOSPITAL_COMMUNITY): Payer: Self-pay | Admitting: Internal Medicine

## 2020-10-25 MED FILL — AMPHETAMINE SALTS 10 MG: 10 | 30 days supply | Qty: 60 | Fill #0

## 2020-11-02 ENCOUNTER — Telehealth: Payer: Self-pay | Admitting: Orthopaedic Surgery

## 2020-11-02 MED ORDER — HYDROCODONE-ACETAMINOPHEN 5-325 MG PO TABS: 1.0000 | ORAL_TABLET | Freq: Four times a day (QID) | ORAL | 0 refills | Status: DC | PRN

## 2020-11-02 NOTE — Telephone Encounter (Signed)
Patient requests refill on Hydrocodone/Acetaminophen 5-325 mgs. Qty 95  Sig:Take 1 tablet by mouth every 6 (six) hours as needed for moderate pain (Must last 30 days).  [facility] 

## 2020-11-24 ENCOUNTER — Other Ambulatory Visit (HOSPITAL_COMMUNITY): Payer: Self-pay | Admitting: Internal Medicine

## 2020-11-24 MED FILL — AMPHETAMINE SALTS 10 MG: 10 | 30 days supply | Qty: 60 | Fill #0

## 2020-12-01 ENCOUNTER — Telehealth: Payer: Self-pay

## 2020-12-01 ENCOUNTER — Encounter: Payer: No Typology Code available for payment source | Admitting: Urology

## 2020-12-01 NOTE — Telephone Encounter (Signed)
Hydrocodone-Acetaminophen 5/325 MG  Take 1 tablet by mouth every 6 (six) hours as needed for moderate pain.(Must last 30 days)  PATIENT USES [facility]

## 2020-12-05 MED ORDER — HYDROCODONE-ACETAMINOPHEN 5-325 MG PO TABS: 1.0000 | ORAL_TABLET | Freq: Four times a day (QID) | ORAL | 0 refills | Status: DC | PRN

## 2020-12-06 ENCOUNTER — Encounter: Payer: Self-pay | Admitting: Orthopaedic Surgery

## 2020-12-06 ENCOUNTER — Other Ambulatory Visit: Payer: Self-pay

## 2020-12-06 ENCOUNTER — Ambulatory Visit (INDEPENDENT_AMBULATORY_CARE_PROVIDER_SITE_OTHER): Payer: No Typology Code available for payment source | Admitting: Orthopaedic Surgery

## 2020-12-06 VITALS — Ht 73.0 in | Wt 189.1 lb

## 2020-12-06 DIAGNOSIS — M542 Cervicalgia: Secondary | ICD-10-CM | POA: Diagnosis not present

## 2020-12-06 NOTE — Progress Notes (Signed)
Patient CV:David Mccarthy, male DOB:1981/08/07, 40 y.o. BOF:[id number]  Chief Complaint  Patient presents with  . Pain    Neck pain/ about the same.    HPI  David Mccarthy is a 40 y.o. male who has chronic neck pain.  He has no new trauma, no paresthesias. The cold weather makes it worse.  He is taking his medicine.  He has a 61 week old little boy.   Body mass index is 24.95 kg/m.  ROS  Review of Systems  HENT: Negative for congestion.   Respiratory: Negative for cough and shortness of breath.   Cardiovascular: Negative for chest pain and leg swelling.  Endocrine: Negative for cold intolerance.  Musculoskeletal: Positive for arthralgias, myalgias and neck pain.  Allergic/Immunologic: Negative for environmental allergies.  All other systems reviewed and are negative.   All other systems reviewed and are negative.  The following is a summary of the past history medically, past history surgically, known current medicines, social history and family history.  This information is gathered electronically by the computer from prior information and documentation.  I review this each visit and have found including this information at this point in the chart is beneficial and informative.    Past Medical History:  Diagnosis Date  . ADHD (attention deficit hyperactivity disorder)     Past Surgical History:  Procedure Laterality Date  . INNER EAR SURGERY    . NOSE SURGERY    . TONSILLECTOMY      History reviewed. No pertinent family history.  Social History Social History   Tobacco Use  . Smoking status: Never Smoker  . Smokeless tobacco: Never Used  Substance Use Topics  . Alcohol use: Yes    Comment: occasional, once every 2 weeks  . Drug use: No    Not on File  Current Outpatient Medications  Medication Sig Dispense Refill  . amphetamine-dextroamphetamine (ADDERALL) 10 MG tablet Take 10 mg by mouth 2 (two) times daily.    . cyclobenzaprine (FLEXERIL) 10 MG tablet  1/2 to one tab po TID prn spasm 40 tablet 4  . HYDROcodone-acetaminophen (NORCO/VICODIN) 5-325 MG tablet Take 1 tablet by mouth every 6 (six) hours as needed for moderate pain (Must last 30 days). 95 tablet 0   No current facility-administered medications for this visit.     Physical Exam  Height 6\' 1"  (1.854 m), weight 189 lb 2 oz (85.8 kg).  Constitutional: overall normal hygiene, normal nutrition, well developed, normal grooming, normal body habitus. Assistive device:none  Musculoskeletal: gait and station Limp none, muscle tone and strength are normal, no tremors or atrophy is present.  .  Neurological: coordination overall normal.  Deep tendon reflex/nerve stretch intact.  Sensation normal.  Cranial nerves II-XII intact.   Skin:   Normal overall no scars, lesions, ulcers or rashes. No psoriasis.  Psychiatric: Alert and oriented x 3.  Recent memory intact, remote memory unclear.  Normal mood and affect. Well groomed.  Good eye contact.  Cardiovascular: overall no swelling, no varicosities, no edema bilaterally, normal temperatures of the legs and arms, no clubbing, cyanosis and good capillary refill.  Lymphatic: palpation is normal.  All other systems reviewed and are negative   The patient has been educated about the nature of the problem(s) and counseled on treatment options.  The patient appeared to understand what I have discussed and is in agreement with it.  Encounter Diagnosis  Name Primary?  . Neck pain Yes    PLAN Call if any  problems.  Precautions discussed.  Continue current medications.   Return to clinic 3 months   Electronically Signed Darreld Mclean, MD 1/4/202211:02 AM

## 2020-12-23 ENCOUNTER — Other Ambulatory Visit (HOSPITAL_COMMUNITY): Payer: Self-pay | Admitting: Internal Medicine

## 2020-12-23 MED FILL — AMPHETAMINE SALTS 10 MG: 10 | 30 days supply | Qty: 60 | Fill #0

## 2021-01-03 ENCOUNTER — Telehealth: Payer: Self-pay | Admitting: Orthopaedic Surgery

## 2021-01-03 MED ORDER — HYDROCODONE-ACETAMINOPHEN 5-325 MG PO TABS: 1.0000 | ORAL_TABLET | Freq: Four times a day (QID) | ORAL | 0 refills | Status: DC | PRN

## 2021-01-03 NOTE — Telephone Encounter (Signed)
Patient request refill on medicine   HYDROcodone-Acetaminophen (Tab) NORCO/VICODIN 5-325 MG     Pharmacy [facility]

## 2021-01-20 ENCOUNTER — Other Ambulatory Visit (HOSPITAL_COMMUNITY): Payer: Self-pay | Admitting: Internal Medicine

## 2021-01-31 ENCOUNTER — Telehealth: Payer: Self-pay | Admitting: Orthopaedic Surgery

## 2021-01-31 MED ORDER — HYDROCODONE-ACETAMINOPHEN 5-325 MG PO TABS: 1.0000 | ORAL_TABLET | Freq: Four times a day (QID) | ORAL | 0 refills | Status: DC | PRN

## 2021-01-31 MED FILL — AMPHETAMINE SALTS 10 MG: 10 | 30 days supply | Qty: 60 | Fill #0

## 2021-01-31 NOTE — Telephone Encounter (Signed)
Patient requests refill on Hydrocodone/Acetaminophen 5-325 mgs. Qty 95  KGU:RKYH 1 tablet by mouth every 6 (six) hours as needed for moderate pain (Must last 30 days).  Patient uses [facility]

## 2021-03-01 ENCOUNTER — Telehealth: Payer: Self-pay | Admitting: Orthopaedic Surgery

## 2021-03-01 NOTE — Telephone Encounter (Signed)
Patient requests refill / aware of upcoming appointment 03/07/21; aware of importance to attend appointment:  Medication: HYDROcodone-acetaminophen (NORCO/VICODIN) 5-325 MG tablet 95 tablet  [facility]

## 2021-03-02 ENCOUNTER — Encounter: Payer: Self-pay | Admitting: Orthopaedic Surgery

## 2021-03-02 ENCOUNTER — Other Ambulatory Visit: Payer: Self-pay

## 2021-03-02 ENCOUNTER — Ambulatory Visit (INDEPENDENT_AMBULATORY_CARE_PROVIDER_SITE_OTHER): Payer: No Typology Code available for payment source | Admitting: Orthopaedic Surgery

## 2021-03-02 VITALS — BP 137/86 | HR 74 | Ht 73.0 in | Wt 192.0 lb

## 2021-03-02 DIAGNOSIS — M542 Cervicalgia: Secondary | ICD-10-CM | POA: Diagnosis not present

## 2021-03-02 MED ORDER — HYDROCODONE-ACETAMINOPHEN 5-325 MG PO TABS: 1.0000 | ORAL_TABLET | Freq: Four times a day (QID) | ORAL | 0 refills | Status: DC | PRN

## 2021-03-02 NOTE — Progress Notes (Signed)
Patient David Mccarthy, male DOB:1981/01/10, 40 y.o. HWT:[id number]  Chief Complaint  Patient presents with  . Neck Pain    Follow up,     HPI  David Mccarthy is a 40 y.o. male who has chronic neck pain.  Rainy weather like today makes it worse.  He has no numbness, no weakness.  He is taking his medicine. He has no trauma.  He is active.   Body mass index is 25.33 kg/m.  ROS  Review of Systems  HENT: Negative for congestion.   Respiratory: Negative for cough and shortness of breath.   Cardiovascular: Negative for chest pain and leg swelling.  Endocrine: Negative for cold intolerance.  Musculoskeletal: Positive for arthralgias, myalgias and neck pain.  Allergic/Immunologic: Negative for environmental allergies.  All other systems reviewed and are negative.   All other systems reviewed and are negative.  The following is a summary of the past history medically, past history surgically, known current medicines, social history and family history.  This information is gathered electronically by the computer from prior information and documentation.  I review this each visit and have found including this information at this point in the chart is beneficial and informative.    Past Medical History:  Diagnosis Date  . ADHD (attention deficit hyperactivity disorder)     Past Surgical History:  Procedure Laterality Date  . INNER EAR SURGERY    . NOSE SURGERY    . TONSILLECTOMY      History reviewed. No pertinent family history.  Social History Social History   Tobacco Use  . Smoking status: Never Smoker  . Smokeless tobacco: Never Used  Substance Use Topics  . Alcohol use: Yes    Comment: occasional, once every 2 weeks  . Drug use: No    No Known Allergies  Current Outpatient Medications  Medication Sig Dispense Refill  . amphetamine-dextroamphetamine (ADDERALL) 10 MG tablet Take 10 mg by mouth 2 (two) times daily.    . cyclobenzaprine (FLEXERIL) 10 MG tablet  1/2 to one tab po TID prn spasm 40 tablet 4  . HYDROcodone-acetaminophen (NORCO/VICODIN) 5-325 MG tablet Take 1 tablet by mouth every 6 (six) hours as needed for moderate pain (Must last 30 days). 95 tablet 0   No current facility-administered medications for this visit.     Physical Exam  Blood pressure 137/86, pulse 74, height 6\' 1"  (1.854 m), weight 192 lb (87.1 kg).  Constitutional: overall normal hygiene, normal nutrition, well developed, normal grooming, normal body habitus. Assistive device:none  Musculoskeletal: gait and station Limp none, muscle tone and strength are normal, no tremors or atrophy is present.  .  Neurological: coordination overall normal.  Deep tendon reflex/nerve stretch intact.  Sensation normal.  Cranial nerves II-XII intact.   Skin:   Normal overall no scars, lesions, ulcers or rashes. No psoriasis.  Psychiatric: Alert and oriented x 3.  Recent memory intact, remote memory unclear.  Normal mood and affect. Well groomed.  Good eye contact.  Cardiovascular: overall no swelling, no varicosities, no edema bilaterally, normal temperatures of the legs and arms, no clubbing, cyanosis and good capillary refill.  Lymphatic: palpation is normal.  Neck has full ROM, no weakness, no spasm.  All other systems reviewed and are negative   The patient has been educated about the nature of the problem(s) and counseled on treatment options.  The patient appeared to understand what I have discussed and is in agreement with it.  Encounter Diagnosis  Name Primary?   Neck pain Yes    PLAN Call if any problems.  Precautions discussed.  Continue current medications.   Return to clinic 3 months   I have reviewed the [facility] Controlled Substance Reporting System web site prior to prescribing narcotic medicine for this patient.   Electronically Signed Darreld Mclean, MD 3/31/202210:45 AM

## 2021-03-03 ENCOUNTER — Other Ambulatory Visit (HOSPITAL_COMMUNITY): Payer: Self-pay | Admitting: Internal Medicine

## 2021-03-03 MED FILL — AMPHETAMINE SALTS 10 MG: 10 | 30 days supply | Qty: 60 | Fill #0

## 2021-03-04 ENCOUNTER — Other Ambulatory Visit (HOSPITAL_COMMUNITY): Payer: Self-pay

## 2021-03-07 ENCOUNTER — Ambulatory Visit: Payer: No Typology Code available for payment source | Admitting: Orthopaedic Surgery

## 2021-03-07 ENCOUNTER — Other Ambulatory Visit (HOSPITAL_COMMUNITY): Payer: Self-pay

## 2021-03-14 ENCOUNTER — Other Ambulatory Visit (HOSPITAL_COMMUNITY): Payer: Self-pay

## 2021-03-31 ENCOUNTER — Telehealth: Payer: Self-pay

## 2021-03-31 ENCOUNTER — Other Ambulatory Visit (HOSPITAL_COMMUNITY): Payer: Self-pay

## 2021-03-31 MED ORDER — AMPHETAMINE-DEXTROAMPHETAMINE 10 MG PO TABS
10.0000 mg | ORAL_TABLET | Freq: Two times a day (BID) | ORAL | 0 refills | Status: DC
  Filled 2021-03-31: qty 60, 30d supply, fill #0

## 2021-03-31 NOTE — Telephone Encounter (Signed)
Hydrocodone-Acetaminophen 5/325 MG Qty 95 Tablets  PATIENT USES [facility] 

## 2021-04-03 ENCOUNTER — Other Ambulatory Visit (HOSPITAL_COMMUNITY): Payer: Self-pay

## 2021-04-03 MED ORDER — HYDROCODONE-ACETAMINOPHEN 5-325 MG PO TABS: 1.0000 | ORAL_TABLET | Freq: Four times a day (QID) | ORAL | 0 refills | Status: DC | PRN

## 2021-05-03 ENCOUNTER — Telehealth: Payer: Self-pay | Admitting: Orthopaedic Surgery

## 2021-05-03 MED ORDER — HYDROCODONE-ACETAMINOPHEN 5-325 MG PO TABS: 1.0000 | ORAL_TABLET | Freq: Four times a day (QID) | ORAL | 0 refills | Status: DC | PRN

## 2021-05-03 NOTE — Telephone Encounter (Signed)
Patient called for refill:  HYDROcodone-acetaminophen (NORCO/VICODIN) 5-325 MG tablet 95 tablet  [facility]

## 2021-05-04 ENCOUNTER — Other Ambulatory Visit (HOSPITAL_COMMUNITY): Payer: Self-pay

## 2021-05-04 MED ORDER — AMPHETAMINE-DEXTROAMPHETAMINE 10 MG PO TABS
10.0000 mg | ORAL_TABLET | Freq: Two times a day (BID) | ORAL | 0 refills | Status: DC
  Filled 2021-05-04: qty 60, 30d supply, fill #0

## 2021-05-05 ENCOUNTER — Other Ambulatory Visit (HOSPITAL_COMMUNITY): Payer: Self-pay

## 2021-06-06 ENCOUNTER — Ambulatory Visit: Payer: No Typology Code available for payment source | Admitting: Orthopaedic Surgery

## 2021-06-06 ENCOUNTER — Encounter: Payer: Self-pay | Admitting: Orthopaedic Surgery

## 2021-06-06 ENCOUNTER — Other Ambulatory Visit: Payer: Self-pay

## 2021-06-06 ENCOUNTER — Other Ambulatory Visit (HOSPITAL_COMMUNITY): Payer: Self-pay

## 2021-06-06 VITALS — BP 134/83 | HR 57 | Ht 73.0 in | Wt 192.2 lb

## 2021-06-06 DIAGNOSIS — M542 Cervicalgia: Secondary | ICD-10-CM

## 2021-06-06 MED ORDER — AMPHETAMINE-DEXTROAMPHETAMINE 10 MG PO TABS
10.0000 mg | ORAL_TABLET | Freq: Two times a day (BID) | ORAL | 0 refills | Status: DC
Start: 2021-06-06 — End: 2021-07-04
  Filled 2021-06-06: qty 60, 30d supply, fill #0

## 2021-06-06 MED ORDER — HYDROCODONE-ACETAMINOPHEN 5-325 MG PO TABS
1.0000 | ORAL_TABLET | Freq: Four times a day (QID) | ORAL | 0 refills | Status: DC | PRN
Start: 2021-06-06 — End: 2021-07-05

## 2021-06-06 NOTE — Progress Notes (Signed)
My neck has its days.  He has chronic neck pain that comes and goes.  He has no new trauma.  He has no weakness or numbness.  He usually does better in the summer months.  He is taking his medicine.  ROM of the neck is full. NV intact.  Encounter Diagnosis  Name Primary?   Neck pain Yes   I have refilled his pain medicine.  I have reviewed the [place] Controlled Substance Reporting System web site prior to prescribing narcotic medicine for this patient.  Return in three months.  Call if any problem.  Precautions discussed.  Electronically Signed Darreld Mclean, MD 7/5/20228:30 AM

## 2021-07-04 ENCOUNTER — Other Ambulatory Visit (HOSPITAL_COMMUNITY): Payer: Self-pay

## 2021-07-04 ENCOUNTER — Telehealth: Payer: Self-pay | Admitting: Orthopaedic Surgery

## 2021-07-04 MED ORDER — AMPHETAMINE-DEXTROAMPHETAMINE 10 MG PO TABS
10.0000 mg | ORAL_TABLET | Freq: Two times a day (BID) | ORAL | 0 refills | Status: DC
  Filled 2021-07-04: qty 60, 30d supply, fill #0

## 2021-07-04 NOTE — Telephone Encounter (Signed)
Patient requests refill, HYDROcodone-acetaminophen (NORCO/VICODIN) 5-325 MG tablet 95 tablet              [facility]

## 2021-07-05 MED ORDER — HYDROCODONE-ACETAMINOPHEN 5-325 MG PO TABS: 1.0000 | ORAL_TABLET | Freq: Four times a day (QID) | ORAL | 0 refills | Status: DC | PRN

## 2021-08-02 ENCOUNTER — Telehealth: Payer: Self-pay

## 2021-08-02 MED ORDER — HYDROCODONE-ACETAMINOPHEN 5-325 MG PO TABS: 1.0000 | ORAL_TABLET | Freq: Four times a day (QID) | ORAL | 0 refills | Status: DC | PRN

## 2021-08-09 ENCOUNTER — Other Ambulatory Visit (HOSPITAL_COMMUNITY): Payer: Self-pay

## 2021-08-09 MED ORDER — AMPHETAMINE-DEXTROAMPHETAMINE 10 MG PO TABS
10.0000 mg | ORAL_TABLET | Freq: Two times a day (BID) | ORAL | 0 refills | Status: DC
  Filled 2021-08-09: qty 60, 30d supply, fill #0

## 2021-09-05 ENCOUNTER — Encounter: Payer: Self-pay | Admitting: Orthopaedic Surgery

## 2021-09-05 ENCOUNTER — Ambulatory Visit (INDEPENDENT_AMBULATORY_CARE_PROVIDER_SITE_OTHER): Payer: No Typology Code available for payment source | Admitting: Orthopaedic Surgery

## 2021-09-05 ENCOUNTER — Other Ambulatory Visit: Payer: Self-pay

## 2021-09-05 VITALS — BP 133/88 | HR 93 | Ht 73.0 in | Wt 190.8 lb

## 2021-09-05 DIAGNOSIS — M542 Cervicalgia: Secondary | ICD-10-CM

## 2021-09-05 MED ORDER — CYCLOBENZAPRINE HCL 10 MG PO TABS: ORAL_TABLET | ORAL | 4 refills | Status: DC

## 2021-09-05 MED ORDER — HYDROCODONE-ACETAMINOPHEN 5-325 MG PO TABS: 1.0000 | ORAL_TABLET | Freq: Four times a day (QID) | ORAL | 0 refills | Status: DC | PRN

## 2021-09-05 NOTE — Progress Notes (Signed)
My neck hurts more with cold weather.  He has had some increased neck pain with the recent cooler weather. He has no numbness, no trauma, no weakness.  He is taking his medicine.  ROM of neck is full.  NV intact.  Encounter Diagnosis  Name Primary?   Neck pain Yes   I have reviewed the [place] Controlled Substance Reporting System web site prior to prescribing narcotic medicine for this patient.  Return in three months.  I refilled the Flexeril as well.  Call if any problem.  Precautions discussed.  Electronically Signed Darreld Mclean, MD 10/4/20228:20 AM

## 2021-09-07 ENCOUNTER — Other Ambulatory Visit (HOSPITAL_COMMUNITY): Payer: Self-pay

## 2021-09-07 MED ORDER — AMPHETAMINE-DEXTROAMPHETAMINE 10 MG PO TABS
10.0000 mg | ORAL_TABLET | Freq: Two times a day (BID) | ORAL | 0 refills | Status: DC
  Filled 2021-09-07: qty 60, 30d supply, fill #0

## 2021-09-25 NOTE — Telephone Encounter (Signed)
refill 

## 2021-10-04 ENCOUNTER — Telehealth: Payer: Self-pay | Admitting: Orthopaedic Surgery

## 2021-10-04 NOTE — Telephone Encounter (Signed)
Patient called requesting refill for his pain medicine.    HYDROcodone-acetaminophen (NORCO/VICODIN) 5-325 MG tablet  Pharmacy [facility]   

## 2021-10-05 MED ORDER — HYDROCODONE-ACETAMINOPHEN 5-325 MG PO TABS: 1.0000 | ORAL_TABLET | Freq: Four times a day (QID) | ORAL | 0 refills | Status: DC | PRN

## 2021-10-11 ENCOUNTER — Other Ambulatory Visit (HOSPITAL_COMMUNITY): Payer: Self-pay

## 2021-10-11 MED ORDER — AMPHETAMINE-DEXTROAMPHETAMINE 10 MG PO TABS
10.0000 mg | ORAL_TABLET | Freq: Two times a day (BID) | ORAL | 0 refills | Status: DC
  Filled 2021-10-11: qty 60, 30d supply, fill #0

## 2021-11-01 ENCOUNTER — Telehealth: Payer: Self-pay | Admitting: Orthopaedic Surgery

## 2021-11-01 NOTE — Telephone Encounter (Signed)
Patient  requests refill HYDROcodone-acetaminophen (NORCO/VICODIN) 5-325 MG tablet [facility] 

## 2021-11-02 MED ORDER — HYDROCODONE-ACETAMINOPHEN 5-325 MG PO TABS: 1.0000 | ORAL_TABLET | Freq: Four times a day (QID) | ORAL | 0 refills | Status: DC | PRN

## 2021-11-02 NOTE — Addendum Note (Signed)
Addended by: Earnstine Regal on: 11/02/2021 08:05 AM   Modules accepted: Orders

## 2021-11-10 ENCOUNTER — Other Ambulatory Visit (HOSPITAL_BASED_OUTPATIENT_CLINIC_OR_DEPARTMENT_OTHER): Payer: Self-pay

## 2021-11-10 MED ORDER — AMPHETAMINE-DEXTROAMPHETAMINE 10 MG PO TABS
ORAL_TABLET | ORAL | 0 refills | Status: DC
  Filled 2021-11-10: qty 60, 30d supply, fill #0

## 2021-11-15 ENCOUNTER — Other Ambulatory Visit (HOSPITAL_COMMUNITY): Payer: Self-pay

## 2021-12-05 ENCOUNTER — Encounter: Payer: Self-pay | Admitting: Orthopaedic Surgery

## 2021-12-05 ENCOUNTER — Other Ambulatory Visit: Payer: Self-pay

## 2021-12-05 ENCOUNTER — Ambulatory Visit: Payer: No Typology Code available for payment source | Admitting: Orthopaedic Surgery

## 2021-12-05 VITALS — BP 121/78 | HR 65 | Ht 73.0 in | Wt 198.4 lb

## 2021-12-05 DIAGNOSIS — M542 Cervicalgia: Secondary | ICD-10-CM | POA: Diagnosis not present

## 2021-12-05 MED ORDER — HYDROCODONE-ACETAMINOPHEN 5-325 MG PO TABS: 1.0000 | ORAL_TABLET | Freq: Four times a day (QID) | ORAL | 0 refills | Status: DC | PRN

## 2021-12-05 NOTE — Progress Notes (Signed)
My neck is about the same.  Good and bad days.  He has chronic neck pain with good and bad days.  He has no new trauma, no weakness, no paresthesias.  He is active and working hard.  His four and a half month old son was recently found to have diabetes.  Neck ROM is full, NV intact.  Encounter Diagnosis  Name Primary?   Neck pain Yes   I will refill pain medicine.  I have reviewed the [place] Controlled Substance Reporting System web site prior to prescribing narcotic medicine for this patient.  Return in three months.  Call if any problem.  Precautions discussed.  Electronically Signed Darreld Mclean, MD 1/3/20238:32 AM

## 2021-12-08 ENCOUNTER — Other Ambulatory Visit (HOSPITAL_BASED_OUTPATIENT_CLINIC_OR_DEPARTMENT_OTHER): Payer: Self-pay

## 2021-12-08 ENCOUNTER — Other Ambulatory Visit (HOSPITAL_COMMUNITY): Payer: Self-pay

## 2021-12-08 MED ORDER — AMPHETAMINE-DEXTROAMPHETAMINE 10 MG PO TABS
ORAL_TABLET | ORAL | 0 refills | Status: DC
  Filled 2021-12-08: qty 60, 30d supply, fill #0

## 2021-12-08 MED ORDER — AMPHETAMINE-DEXTROAMPHETAMINE 10 MG PO TABS: 10.0000 mg | ORAL_TABLET | Freq: Two times a day (BID) | ORAL | 0 refills | Status: DC

## 2021-12-12 ENCOUNTER — Other Ambulatory Visit (HOSPITAL_COMMUNITY): Payer: Self-pay

## 2022-01-03 ENCOUNTER — Telehealth: Payer: Self-pay | Admitting: Orthopaedic Surgery

## 2022-01-03 NOTE — Telephone Encounter (Signed)
Patient called requesting refill for his pain medicine.    HYDROcodone-acetaminophen (NORCO/VICODIN) 5-325 MG tablet  Pharmacy [facility]   

## 2022-01-04 MED ORDER — HYDROCODONE-ACETAMINOPHEN 5-325 MG PO TABS: 1.0000 | ORAL_TABLET | Freq: Four times a day (QID) | ORAL | 0 refills | Status: DC | PRN

## 2022-01-10 ENCOUNTER — Other Ambulatory Visit (HOSPITAL_COMMUNITY): Payer: Self-pay

## 2022-01-10 MED ORDER — AMPHETAMINE-DEXTROAMPHETAMINE 10 MG PO TABS
10.0000 mg | ORAL_TABLET | Freq: Two times a day (BID) | ORAL | 0 refills | Status: DC
  Filled 2022-01-10: qty 60, 30d supply, fill #0

## 2022-02-01 ENCOUNTER — Other Ambulatory Visit: Payer: Self-pay | Admitting: Orthopaedic Surgery

## 2022-02-01 MED ORDER — HYDROCODONE-ACETAMINOPHEN 5-325 MG PO TABS: 1.0000 | ORAL_TABLET | Freq: Four times a day (QID) | ORAL | 0 refills | Status: DC | PRN

## 2022-02-01 NOTE — Telephone Encounter (Signed)
Patient called for refill on his  ? ?HYDROcodone-acetaminophen (NORCO/VICODIN) 5-325 MG tablet ? ? ?Pharmacy:  [facility]  ?

## 2022-02-12 ENCOUNTER — Other Ambulatory Visit (HOSPITAL_COMMUNITY): Payer: Self-pay

## 2022-02-12 MED ORDER — AMPHETAMINE-DEXTROAMPHETAMINE 10 MG PO TABS
10.0000 mg | ORAL_TABLET | Freq: Two times a day (BID) | ORAL | 0 refills | Status: DC
  Filled 2022-02-12: qty 60, 30d supply, fill #0

## 2022-03-06 ENCOUNTER — Encounter: Payer: Self-pay | Admitting: Orthopaedic Surgery

## 2022-03-06 ENCOUNTER — Ambulatory Visit: Payer: No Typology Code available for payment source | Admitting: Orthopaedic Surgery

## 2022-03-06 VITALS — BP 144/94 | HR 71 | Ht 73.0 in | Wt 187.0 lb

## 2022-03-06 DIAGNOSIS — M542 Cervicalgia: Secondary | ICD-10-CM | POA: Diagnosis not present

## 2022-03-06 MED ORDER — HYDROCODONE-ACETAMINOPHEN 5-325 MG PO TABS: 1.0000 | ORAL_TABLET | Freq: Four times a day (QID) | ORAL | 0 refills | Status: DC | PRN

## 2022-03-06 NOTE — Progress Notes (Signed)
I am not having much pain today. ? ?He has chronic pain of the neck.  He has good and bad days.  It is more dependent on the weather and his activity.  He has less pain today.  He has no new trauma, no weakness. ? ?His son is doing better with his diabetes. He has a pump. ? ?BP (!) 144/94   Pulse 71   Ht 6\' 1"  (1.854 m)   Wt 187 lb (84.8 kg)   BMI 24.67 kg/m?  ? ?Neck has full ROM, NV intact.  No spasm. ? ?Encounter Diagnosis  ?Name Primary?  ? Neck pain Yes  ? ?I have reviewed the Controlled Substance Reporting System web site prior to prescribing narcotic medicine for this patient. ? ?Return in three months. ? ?Call if any problem. ? ?Precautions discussed. ? ?Electronically Signed ?[place], MD ?4/4/20238:25 AM ? ?

## 2022-03-21 ENCOUNTER — Other Ambulatory Visit (HOSPITAL_COMMUNITY): Payer: Self-pay

## 2022-03-21 MED ORDER — AMPHETAMINE-DEXTROAMPHETAMINE 10 MG PO TABS
10.0000 mg | ORAL_TABLET | Freq: Two times a day (BID) | ORAL | 0 refills | Status: DC
  Filled 2022-03-21: qty 60, 30d supply, fill #0

## 2022-04-04 ENCOUNTER — Telehealth: Payer: Self-pay | Admitting: Orthopaedic Surgery

## 2022-04-04 NOTE — Telephone Encounter (Signed)
Patient requests refill: HYDROcodone-acetaminophen (NORCO/VICODIN) 5-325 MG tablet 95 tablet  -[facility]  

## 2022-04-05 MED ORDER — HYDROCODONE-ACETAMINOPHEN 5-325 MG PO TABS: 1.0000 | ORAL_TABLET | Freq: Four times a day (QID) | ORAL | 0 refills | Status: DC | PRN

## 2022-04-20 ENCOUNTER — Other Ambulatory Visit (HOSPITAL_COMMUNITY): Payer: Self-pay

## 2022-04-20 MED ORDER — AMPHETAMINE-DEXTROAMPHETAMINE 10 MG PO TABS
10.0000 mg | ORAL_TABLET | Freq: Two times a day (BID) | ORAL | 0 refills | Status: DC
  Filled 2022-04-20: qty 39, 20d supply, fill #0
  Filled 2022-04-20: qty 21, 10d supply, fill #0

## 2022-05-03 ENCOUNTER — Other Ambulatory Visit: Payer: Self-pay | Admitting: Orthopaedic Surgery

## 2022-05-03 MED ORDER — HYDROCODONE-ACETAMINOPHEN 5-325 MG PO TABS: 1.0000 | ORAL_TABLET | Freq: Four times a day (QID) | ORAL | 0 refills | Status: DC | PRN

## 2022-05-03 NOTE — Telephone Encounter (Signed)
Patient of Dr Sanjuan Dame called for refill - aware while Dr Hilda Lias is out of clinic that requests are being reviewed by our other providers: HYDROcodone-acetaminophen (NORCO/VICODIN) 5-325 MG tablet [facility]

## 2022-05-21 ENCOUNTER — Other Ambulatory Visit (HOSPITAL_COMMUNITY): Payer: Self-pay

## 2022-05-21 MED ORDER — AMPHETAMINE-DEXTROAMPHETAMINE 10 MG PO TABS
10.0000 mg | ORAL_TABLET | Freq: Two times a day (BID) | ORAL | 0 refills | Status: DC
  Filled 2022-05-21: qty 60, 30d supply, fill #0

## 2022-05-31 ENCOUNTER — Telehealth: Payer: Self-pay

## 2022-05-31 NOTE — Telephone Encounter (Signed)
Hydrocodone-Acetaminophen 5/325 MG  PATIENT USES [facility]

## 2022-06-10 MED ORDER — HYDROCODONE-ACETAMINOPHEN 5-325 MG PO TABS: 1.0000 | ORAL_TABLET | Freq: Four times a day (QID) | ORAL | 0 refills | Status: DC | PRN

## 2022-06-12 ENCOUNTER — Encounter: Payer: Self-pay | Admitting: Orthopaedic Surgery

## 2022-06-12 ENCOUNTER — Ambulatory Visit (INDEPENDENT_AMBULATORY_CARE_PROVIDER_SITE_OTHER): Payer: No Typology Code available for payment source | Admitting: Orthopaedic Surgery

## 2022-06-12 VITALS — BP 118/74 | HR 66 | Ht 73.0 in | Wt 187.0 lb

## 2022-06-12 DIAGNOSIS — M542 Cervicalgia: Secondary | ICD-10-CM | POA: Diagnosis not present

## 2022-06-12 NOTE — Progress Notes (Signed)
My neck is better today  He had increased neck pain about three weeks ago when he overdid it.  He has no paresthesias, no weakness.  He is doing his exercises and taking his medicine.  Neck has full ROM, no spasm, NV intact. BP 118/74   Pulse 66   Ht 6\' 1"  (1.854 m)   Wt 187 lb (84.8 kg)   BMI 24.67 kg/m    Encounter Diagnosis  Name Primary?   Neck pain Yes   Return in three months.  Call if any problem.  Precautions discussed.  Electronically Signed , MD 7/11/20238:39 AM

## 2022-06-21 ENCOUNTER — Other Ambulatory Visit: Payer: Self-pay

## 2022-06-21 ENCOUNTER — Other Ambulatory Visit (HOSPITAL_COMMUNITY): Payer: Self-pay

## 2022-06-21 MED ORDER — AMPHETAMINE-DEXTROAMPHETAMINE 10 MG PO TABS
10.0000 mg | ORAL_TABLET | Freq: Two times a day (BID) | ORAL | 0 refills | Status: DC
  Filled 2022-06-21: qty 60, 30d supply, fill #0

## 2022-06-22 ENCOUNTER — Other Ambulatory Visit (HOSPITAL_COMMUNITY): Payer: Self-pay

## 2022-06-26 ENCOUNTER — Other Ambulatory Visit (HOSPITAL_COMMUNITY): Payer: Self-pay

## 2022-07-13 ENCOUNTER — Telehealth: Payer: Self-pay

## 2022-07-13 NOTE — Telephone Encounter (Signed)
Hydrocodone-Acetaminophen 5/325 MG Qty 95 Tablets  PATIENT USES [facility] 

## 2022-07-16 MED ORDER — HYDROCODONE-ACETAMINOPHEN 5-325 MG PO TABS: 1.0000 | ORAL_TABLET | Freq: Four times a day (QID) | ORAL | 0 refills | Status: DC | PRN

## 2022-07-24 ENCOUNTER — Other Ambulatory Visit (HOSPITAL_COMMUNITY): Payer: Self-pay

## 2022-07-24 MED ORDER — AMPHETAMINE-DEXTROAMPHETAMINE 10 MG PO TABS
10.0000 mg | ORAL_TABLET | Freq: Two times a day (BID) | ORAL | 0 refills | Status: DC
  Filled 2022-07-24: qty 60, 30d supply, fill #0

## 2022-08-14 ENCOUNTER — Telehealth: Payer: Self-pay | Admitting: Orthopaedic Surgery

## 2022-08-14 MED ORDER — HYDROCODONE-ACETAMINOPHEN 5-325 MG PO TABS: 1.0000 | ORAL_TABLET | Freq: Four times a day (QID) | ORAL | 0 refills | Status: DC | PRN

## 2022-08-14 NOTE — Telephone Encounter (Signed)
Patient requests refill:  HYDROcodone-acetaminophen (NORCO/VICODIN) 5-325 MG tablet 95 tablet       [facility]

## 2022-08-23 ENCOUNTER — Other Ambulatory Visit (HOSPITAL_COMMUNITY): Payer: Self-pay

## 2022-08-23 MED ORDER — AMPHETAMINE-DEXTROAMPHETAMINE 10 MG PO TABS
10.0000 mg | ORAL_TABLET | Freq: Two times a day (BID) | ORAL | 0 refills | Status: DC
  Filled 2022-08-23: qty 60, 30d supply, fill #0

## 2022-08-29 ENCOUNTER — Other Ambulatory Visit (HOSPITAL_COMMUNITY): Payer: Self-pay

## 2022-09-12 ENCOUNTER — Ambulatory Visit: Payer: No Typology Code available for payment source | Admitting: Orthopaedic Surgery

## 2022-09-19 ENCOUNTER — Ambulatory Visit (INDEPENDENT_AMBULATORY_CARE_PROVIDER_SITE_OTHER): Payer: No Typology Code available for payment source | Admitting: Orthopaedic Surgery

## 2022-09-19 ENCOUNTER — Encounter: Payer: Self-pay | Admitting: Orthopaedic Surgery

## 2022-09-19 DIAGNOSIS — M542 Cervicalgia: Secondary | ICD-10-CM

## 2022-09-19 MED ORDER — HYDROCODONE-ACETAMINOPHEN 5-325 MG PO TABS: 1.0000 | ORAL_TABLET | Freq: Four times a day (QID) | ORAL | 0 refills | Status: DC | PRN

## 2022-09-19 NOTE — Patient Instructions (Signed)
As the weather changes and gets cooler, you may notice you are affected more. You may have more pain in your joints. This is normal. Dress warmly and make sure that area is covered well.   

## 2022-09-19 NOTE — Progress Notes (Signed)
My neck is about the same.  He has chronic neck pain.  He has had some paresthesias on the left hand at times but infrequently.  He has no weakness, no trauma.  He is active and taking his medicine.  Neck has full ROM, NV intact.  Encounter Diagnosis  Name Primary?   Neck pain Yes   I have reviewed the [place] web site prior to prescribing narcotic medicine for this patient.  Return in three months.  Call if any problem.  Precautions discussed.  Electronically Signed Sanjuana Kava, MD 10/18/20238:28 AM

## 2022-09-24 ENCOUNTER — Other Ambulatory Visit (HOSPITAL_COMMUNITY): Payer: Self-pay

## 2022-09-24 MED ORDER — AMPHETAMINE-DEXTROAMPHETAMINE 10 MG PO TABS
10.0000 mg | ORAL_TABLET | Freq: Two times a day (BID) | ORAL | 0 refills | Status: DC
  Filled 2022-09-24: qty 60, 30d supply, fill #0

## 2022-09-27 ENCOUNTER — Other Ambulatory Visit (HOSPITAL_COMMUNITY): Payer: Self-pay

## 2022-10-17 ENCOUNTER — Telehealth: Payer: Self-pay | Admitting: Orthopaedic Surgery

## 2022-10-17 NOTE — Telephone Encounter (Signed)
Patient called requesting a refill on Hydrocodone 5-325, 1 tablet, Oral, Every 6 hours PRN, moderate pain, Must last 30 days, [facility].  Pt's # [phone]

## 2022-10-18 MED ORDER — HYDROCODONE-ACETAMINOPHEN 5-325 MG PO TABS: 1.0000 | ORAL_TABLET | Freq: Four times a day (QID) | ORAL | 0 refills | Status: DC | PRN

## 2022-10-18 NOTE — Telephone Encounter (Signed)
Request sent to provider.

## 2022-10-30 ENCOUNTER — Other Ambulatory Visit (HOSPITAL_COMMUNITY): Payer: Self-pay

## 2022-10-30 MED ORDER — AMPHETAMINE-DEXTROAMPHETAMINE 10 MG PO TABS
10.0000 mg | ORAL_TABLET | Freq: Two times a day (BID) | ORAL | 0 refills | Status: DC
  Filled 2022-10-30: qty 60, 30d supply, fill #0

## 2022-10-31 ENCOUNTER — Other Ambulatory Visit (HOSPITAL_COMMUNITY): Payer: Self-pay

## 2022-11-15 ENCOUNTER — Telehealth: Payer: Self-pay | Admitting: Orthopaedic Surgery

## 2022-11-15 MED ORDER — HYDROCODONE-ACETAMINOPHEN 5-325 MG PO TABS: 1.0000 | ORAL_TABLET | Freq: Four times a day (QID) | ORAL | 0 refills | Status: DC | PRN

## 2022-11-15 NOTE — Telephone Encounter (Signed)
Patient called requesting refill on his pain medicine   HYDROcodone-acetaminophen (NORCO/VICODIN) 5-325 MG tablet   Pharmacy:  [facility]

## 2022-11-28 ENCOUNTER — Other Ambulatory Visit (HOSPITAL_COMMUNITY): Payer: Self-pay

## 2022-11-28 MED ORDER — AMPHETAMINE-DEXTROAMPHETAMINE 10 MG PO TABS
10.0000 mg | ORAL_TABLET | Freq: Two times a day (BID) | ORAL | 0 refills | Status: AC
  Filled 2022-12-07: qty 60, 30d supply, fill #0

## 2022-12-07 ENCOUNTER — Other Ambulatory Visit (HOSPITAL_COMMUNITY): Payer: Self-pay

## 2022-12-20 ENCOUNTER — Encounter: Payer: Self-pay | Admitting: Orthopaedic Surgery

## 2022-12-20 ENCOUNTER — Ambulatory Visit: Payer: 59 | Admitting: Orthopaedic Surgery

## 2022-12-20 VITALS — BP 123/77 | HR 64 | Ht 73.0 in | Wt 200.0 lb

## 2022-12-20 DIAGNOSIS — M542 Cervicalgia: Secondary | ICD-10-CM | POA: Diagnosis not present

## 2022-12-20 MED ORDER — HYDROCODONE-ACETAMINOPHEN 5-325 MG PO TABS: 1.0000 | ORAL_TABLET | Freq: Four times a day (QID) | ORAL | 0 refills | Status: DC | PRN

## 2022-12-20 NOTE — Progress Notes (Signed)
My neck has its moments.  He has chronic neck pain that has more pain with cold weather.  He has no numbness, no trauma, no weakness.  He is active and taking his medicine.  Neck has good ROM and no spasm.  NV intact.  Encounter Diagnosis  Name Primary?   Neck pain Yes   I have reviewed the [place] web site prior to prescribing narcotic medicine for this patient.  Return in three months.  Call if any problem.  Precautions discussed.  Electronically Signed Sanjuana Kava, MD 1/18/20248:49 AM

## 2023-01-02 DIAGNOSIS — E291 Testicular hypofunction: Secondary | ICD-10-CM | POA: Diagnosis not present

## 2023-01-02 DIAGNOSIS — F988 Other specified behavioral and emotional disorders with onset usually occurring in childhood and adolescence: Secondary | ICD-10-CM | POA: Diagnosis not present

## 2023-01-16 ENCOUNTER — Telehealth: Payer: Self-pay

## 2023-01-16 NOTE — Telephone Encounter (Signed)
Hydrocodone-Acetaminophen 5/325 MG Qty 95 Tablets  PATIENT USES [facility]

## 2023-01-18 NOTE — Telephone Encounter (Signed)
Hydrocodone-Acetaminophen 5/325 MG Qty 95 Tablets    Take 1 tablet by mouth every 6 (six) hours as needed for moderate pain (Must last 30 days). Dispense: 95 tablet, Refills: 0 ordered   PATIENT USES [facility]

## 2023-01-18 NOTE — Telephone Encounter (Signed)
I called him to advise, he was upset, I advised him refill request received on Thursday afternoon and meds are not refilled on Thursday afternoons and Fridays, he voiced understanding. He asked what he needs to do to keep from happening again, since this happens a lot. I suggested he speak to Dr Luna Glasgow about it. He again voiced understanding.

## 2023-01-18 NOTE — Telephone Encounter (Signed)
Rx will need to hold for Dr Luna Glasgow then.  Per medication refill policy all Rx requests have to be in by 12 noon on Thursdays or will not be addressed until the following Monday.  If you will please call the patient and advise David Mccarthy?

## 2023-01-21 MED ORDER — HYDROCODONE-ACETAMINOPHEN 5-325 MG PO TABS: 1.0000 | ORAL_TABLET | Freq: Four times a day (QID) | ORAL | 0 refills | Status: DC | PRN

## 2023-01-31 ENCOUNTER — Other Ambulatory Visit (HOSPITAL_COMMUNITY): Payer: Self-pay

## 2023-01-31 ENCOUNTER — Encounter: Payer: Self-pay | Admitting: Radiology

## 2023-02-19 ENCOUNTER — Telehealth: Payer: Self-pay

## 2023-02-19 MED ORDER — HYDROCODONE-ACETAMINOPHEN 5-325 MG PO TABS: 1.0000 | ORAL_TABLET | Freq: Four times a day (QID) | ORAL | 0 refills | Status: DC | PRN

## 2023-02-19 NOTE — Telephone Encounter (Signed)
Hydrocodone-Acetaminophen 5/325 MG Qty 95 Tablets  PATIENT USES [facility] 

## 2023-03-19 ENCOUNTER — Telehealth: Payer: Self-pay

## 2023-03-19 NOTE — Telephone Encounter (Signed)
Hydrocodone-Acetaminophen 5/325 MG   Qty 95 Tablets  PATIENT USES [facility] [address]

## 2023-03-20 ENCOUNTER — Other Ambulatory Visit: Payer: Self-pay | Admitting: Orthopaedic Surgery

## 2023-03-20 MED ORDER — HYDROCODONE-ACETAMINOPHEN 5-325 MG PO TABS: 1.0000 | ORAL_TABLET | Freq: Four times a day (QID) | ORAL | 0 refills | Status: DC | PRN

## 2023-03-20 NOTE — Telephone Encounter (Signed)
Dr. Sanjuan Dame pt - pt lvm stating that [facility] doesn't have the Hydrocodone, please call to [facility]

## 2023-03-21 ENCOUNTER — Telehealth: Payer: Self-pay | Admitting: Orthopaedic Surgery

## 2023-03-21 ENCOUNTER — Other Ambulatory Visit: Payer: Self-pay

## 2023-03-21 ENCOUNTER — Ambulatory Visit: Payer: Self-pay | Admitting: Orthopaedic Surgery

## 2023-03-21 MED ORDER — HYDROCODONE-ACETAMINOPHEN 5-325 MG PO TABS: 1.0000 | ORAL_TABLET | Freq: Four times a day (QID) | ORAL | 0 refills | Status: DC | PRN

## 2023-03-21 NOTE — Telephone Encounter (Signed)
Prescription sent to [facility] 

## 2023-03-21 NOTE — Telephone Encounter (Signed)
Dr. Hilda Lias - the patient called again, stated that [facility] told him they aren't taking new customers, please send his meds to [facility].

## 2023-04-10 ENCOUNTER — Ambulatory Visit (INDEPENDENT_AMBULATORY_CARE_PROVIDER_SITE_OTHER): Payer: BC Managed Care – PPO | Admitting: Orthopaedic Surgery

## 2023-04-10 ENCOUNTER — Encounter: Payer: Self-pay | Admitting: Orthopaedic Surgery

## 2023-04-10 VITALS — BP 119/84 | HR 56 | Ht 73.0 in | Wt 200.0 lb

## 2023-04-10 DIAGNOSIS — M542 Cervicalgia: Secondary | ICD-10-CM | POA: Diagnosis not present

## 2023-04-10 NOTE — Progress Notes (Signed)
I still have some pain.  He has good and bad days with his neck, more good days recently.  Weather affects his neck and rainy weather is the worst.  He has no paresthesias.  ROM of the neck is full.  NV intact.  Encounter Diagnosis  Name Primary?   Neck pain Yes   Continue his medicines.  Return in three months.  Call if any problem.  Precautions discussed.  Electronically Signed Darreld Mclean, MD 5/8/20248:51 AM

## 2023-04-18 ENCOUNTER — Telehealth: Payer: Self-pay

## 2023-04-18 NOTE — Telephone Encounter (Signed)
[place] pt-----Hydrocodone-Acetaminophen 5/325 MG  Qty 95 Tablets  Take 1 tablet by mouth every 6 (six) hours as needed for moderate pain (Must last 30 days).    PATIENT USES [facility]  ******Patient is asking for it to be sent to pharmacy and they will not fill it until Saturday. He just don't want to have to wait until Tuesday for it to be filled, he works out of town sometimes.

## 2023-04-21 MED ORDER — HYDROCODONE-ACETAMINOPHEN 5-325 MG PO TABS: 1.0000 | ORAL_TABLET | Freq: Four times a day (QID) | ORAL | 0 refills | Status: DC | PRN

## 2023-05-23 ENCOUNTER — Telehealth: Payer: Self-pay

## 2023-05-23 NOTE — Telephone Encounter (Signed)
Hydrocodone-Acetaminophen 5/325 MG Qty 95 Tablets  PATIENT USES [facility] 

## 2023-05-24 MED ORDER — HYDROCODONE-ACETAMINOPHEN 5-325 MG PO TABS: 1.0000 | ORAL_TABLET | Freq: Four times a day (QID) | ORAL | 0 refills | Status: DC | PRN

## 2023-06-20 ENCOUNTER — Telehealth: Payer: Self-pay | Admitting: Orthopaedic Surgery

## 2023-06-20 NOTE — Telephone Encounter (Signed)
Dr. Sanjuan Dame pt - pt lvm requesting a refill on Hydrocodone to be sent to [facility]

## 2023-06-21 MED ORDER — HYDROCODONE-ACETAMINOPHEN 5-325 MG PO TABS: 1.0000 | ORAL_TABLET | Freq: Four times a day (QID) | ORAL | 0 refills | Status: DC | PRN

## 2023-06-26 DIAGNOSIS — Z79899 Other long term (current) drug therapy: Secondary | ICD-10-CM | POA: Diagnosis not present

## 2023-06-26 DIAGNOSIS — E291 Testicular hypofunction: Secondary | ICD-10-CM | POA: Diagnosis not present

## 2023-06-26 DIAGNOSIS — E1065 Type 1 diabetes mellitus with hyperglycemia: Secondary | ICD-10-CM | POA: Diagnosis not present

## 2023-06-26 DIAGNOSIS — F909 Attention-deficit hyperactivity disorder, unspecified type: Secondary | ICD-10-CM | POA: Diagnosis not present

## 2023-07-05 DIAGNOSIS — F900 Attention-deficit hyperactivity disorder, predominantly inattentive type: Secondary | ICD-10-CM | POA: Diagnosis not present

## 2023-07-05 DIAGNOSIS — R7309 Other abnormal glucose: Secondary | ICD-10-CM | POA: Diagnosis not present

## 2023-07-05 DIAGNOSIS — Z0000 Encounter for general adult medical examination without abnormal findings: Secondary | ICD-10-CM | POA: Diagnosis not present

## 2023-07-05 DIAGNOSIS — E291 Testicular hypofunction: Secondary | ICD-10-CM | POA: Diagnosis not present

## 2023-07-17 ENCOUNTER — Ambulatory Visit: Payer: BC Managed Care – PPO | Admitting: Orthopaedic Surgery

## 2023-07-17 ENCOUNTER — Encounter: Payer: Self-pay | Admitting: Orthopaedic Surgery

## 2023-07-17 VITALS — BP 117/76 | HR 62

## 2023-07-17 DIAGNOSIS — M542 Cervicalgia: Secondary | ICD-10-CM | POA: Diagnosis not present

## 2023-07-17 MED ORDER — HYDROCODONE-ACETAMINOPHEN 5-325 MG PO TABS: 1.0000 | ORAL_TABLET | Freq: Four times a day (QID) | ORAL | 0 refills | Status: DC | PRN

## 2023-07-17 MED ORDER — CYCLOBENZAPRINE HCL 10 MG PO TABS: ORAL_TABLET | ORAL | 4 refills | Status: AC

## 2023-07-17 NOTE — Progress Notes (Signed)
My neck is the same.  He has less pain in the neck recently.  He does better in the summer months.  He did have episode of lower back pain with right sided paresthesias a couple of weeks ago but that has resolved.  ROM of neck is full.  NV intact.  Grips normal.   Encounter Diagnosis  Name Primary?   Neck pain Yes   I have reviewed the [place] Controlled Substance Reporting System web site prior to prescribing narcotic medicine for this patient.  Return in three months.  Call if any problem.  Precautions discussed.  Electronically Signed Darreld Mclean, MD 8/14/20248:39 AM

## 2023-07-19 ENCOUNTER — Other Ambulatory Visit: Payer: Self-pay

## 2023-07-23 MED ORDER — HYDROCODONE-ACETAMINOPHEN 5-325 MG PO TABS: 1.0000 | ORAL_TABLET | Freq: Four times a day (QID) | ORAL | 0 refills | Status: DC | PRN

## 2023-08-21 ENCOUNTER — Telehealth: Payer: Self-pay | Admitting: Orthopaedic Surgery

## 2023-08-21 MED ORDER — HYDROCODONE-ACETAMINOPHEN 5-325 MG PO TABS: 1.0000 | ORAL_TABLET | Freq: Four times a day (QID) | ORAL | 0 refills | Status: DC | PRN

## 2023-08-21 NOTE — Addendum Note (Signed)
Addended by: Michaele Offer on: 08/21/2023 01:13 PM   Modules accepted: Orders

## 2023-08-21 NOTE — Addendum Note (Signed)
Addended by: Earnstine Regal on: 08/21/2023 06:31 PM   Modules accepted: Orders

## 2023-08-21 NOTE — Telephone Encounter (Signed)
Dr. Sanjuan Dame pt - pt is requesting a refill on Hydrocodone 5-325 to be sent to [facility]

## 2023-09-18 ENCOUNTER — Other Ambulatory Visit: Payer: Self-pay | Admitting: Orthopaedic Surgery

## 2023-09-18 MED ORDER — HYDROCODONE-ACETAMINOPHEN 5-325 MG PO TABS: 1.0000 | ORAL_TABLET | Freq: Four times a day (QID) | ORAL | 0 refills | Status: DC | PRN

## 2023-09-18 NOTE — Telephone Encounter (Signed)
Dr. Sanjuan Dame pt - pt lvm requesting a refill on Hydrocodone 5-325 to be sent to [facility]

## 2023-09-20 NOTE — Addendum Note (Signed)
Addended by: Michaele Offer on: 09/20/2023 08:38 AM   Modules accepted: Orders

## 2023-10-16 ENCOUNTER — Encounter: Payer: Self-pay | Admitting: Orthopaedic Surgery

## 2023-10-16 ENCOUNTER — Ambulatory Visit: Payer: BC Managed Care – PPO | Admitting: Orthopaedic Surgery

## 2023-10-16 VITALS — BP 118/75 | HR 71 | Ht 73.0 in | Wt 190.0 lb

## 2023-10-16 DIAGNOSIS — M542 Cervicalgia: Secondary | ICD-10-CM | POA: Diagnosis not present

## 2023-10-16 NOTE — Progress Notes (Signed)
My neck is about the same.  He has good and bad days with his neck pain.  He has no new trauma, no paresthesias, no weakness.  He is taking his medicine and doing his normal work.  ROM of the neck is full.  NV intact.  Encounter Diagnosis  Name Primary?   Neck pain Yes   Continue medicine as needed.  Return in three months.  Call if any problem.  Precautions discussed.  Electronically Signed Darreld Mclean, MD 11/13/20248:20 AM

## 2023-10-21 ENCOUNTER — Telehealth: Payer: Self-pay | Admitting: Orthopaedic Surgery

## 2023-10-21 MED ORDER — HYDROCODONE-ACETAMINOPHEN 5-325 MG PO TABS: 1.0000 | ORAL_TABLET | Freq: Four times a day (QID) | ORAL | 0 refills | Status: DC | PRN

## 2023-10-21 NOTE — Telephone Encounter (Signed)
Dr. Sanjuan Dame pt - pt lvm requesting a refill on his Hydrocodone 5-325 to be sent to [facility]

## 2023-11-19 ENCOUNTER — Telehealth: Payer: Self-pay | Admitting: Orthopaedic Surgery

## 2023-11-19 NOTE — Telephone Encounter (Signed)
Dr. Sanjuan Dame pt - spoke w/the pt, he is requesting a refill on Hydrocodone 5-325 to be sent to [facility]

## 2023-11-20 MED ORDER — HYDROCODONE-ACETAMINOPHEN 5-325 MG PO TABS: 1.0000 | ORAL_TABLET | Freq: Four times a day (QID) | ORAL | 0 refills | Status: DC | PRN

## 2023-12-19 ENCOUNTER — Telehealth: Payer: Self-pay | Admitting: Orthopaedic Surgery

## 2023-12-19 NOTE — Telephone Encounter (Signed)
Dr. Sanjuan Dame pt - pt lvm stating he has had a pharmacy change, [facility] [place]  He is requesting a refill on Hydrocodone 5-[facility] be [facility] [place].

## 2023-12-20 MED ORDER — HYDROCODONE-ACETAMINOPHEN 5-325 MG PO TABS: 1.0000 | ORAL_TABLET | Freq: Four times a day (QID) | ORAL | 0 refills | Status: DC | PRN

## 2024-01-14 DIAGNOSIS — F900 Attention-deficit hyperactivity disorder, predominantly inattentive type: Secondary | ICD-10-CM | POA: Diagnosis not present

## 2024-01-14 DIAGNOSIS — E291 Testicular hypofunction: Secondary | ICD-10-CM | POA: Diagnosis not present

## 2024-01-16 ENCOUNTER — Ambulatory Visit: Payer: No Typology Code available for payment source | Admitting: Orthopaedic Surgery

## 2024-01-16 ENCOUNTER — Encounter: Payer: Self-pay | Admitting: Orthopaedic Surgery

## 2024-01-16 VITALS — BP 117/72 | HR 76 | Ht 73.0 in | Wt 200.0 lb

## 2024-01-16 DIAGNOSIS — M542 Cervicalgia: Secondary | ICD-10-CM | POA: Diagnosis not present

## 2024-01-16 NOTE — Progress Notes (Signed)
My neck hurts more with the cold rain  He has more neck pain with the recent cold wet weather.  He has no new trauma.  He has no weakness.  He is active and taking his medicine.  ROM of the neck is full.  NV intact.  Encounter Diagnosis  Name Primary?   Neck pain Yes   Continue his exercises and activity.  Return in three months.  Call if any problem.  Precautions discussed.  Electronically Signed Darreld Mclean, MD 2/13/20258:25 AM

## 2024-01-21 ENCOUNTER — Telehealth: Payer: Self-pay | Admitting: Orthopaedic Surgery

## 2024-01-21 MED ORDER — HYDROCODONE-ACETAMINOPHEN 5-325 MG PO TABS: 1.0000 | ORAL_TABLET | Freq: Four times a day (QID) | ORAL | 0 refills | Status: DC | PRN

## 2024-01-21 NOTE — Telephone Encounter (Signed)
 Dr. Sanjuan Dame pt - spoke w/the pt, he is requesting a refill on Hydrocodone 5-325 to be [facility] [place].

## 2024-01-23 MED ORDER — HYDROCODONE-ACETAMINOPHEN 5-325 MG PO TABS: 1.0000 | ORAL_TABLET | Freq: Four times a day (QID) | ORAL | 0 refills | Status: DC | PRN

## 2024-01-23 NOTE — Telephone Encounter (Signed)
 Dr. Sanjuan Dame pt - spoke w/the pt, he stated that his meds were sent in, but the pharmacy is wanting to do a prior auth, he wanted to pay cash and they will not allow him to bc he has insurance.  He is wanting his meds sent to [facility] now.

## 2024-01-23 NOTE — Addendum Note (Signed)
 Addended by: Earnstine Regal on: 01/23/2024 11:04 AM   Modules accepted: Orders

## 2024-01-27 NOTE — Telephone Encounter (Signed)
 Dr. Sanjuan Dame pt - spoke w/the pt, he stated he didn't think Dr. Sanjuan Dame nurse got the message last week, I assured him it was relayed.  He wants to know the status of it being sent to [facility]. [phone]

## 2024-01-29 NOTE — Telephone Encounter (Signed)
 Dr. Sanjuan Dame pt - spoke w/the pt, he would like a call back as to what's going on w/his medication.  [phone]

## 2024-01-30 MED ORDER — HYDROCODONE-ACETAMINOPHEN 5-325 MG PO TABS: 1.0000 | ORAL_TABLET | Freq: Four times a day (QID) | ORAL | 0 refills | Status: DC | PRN

## 2024-01-30 NOTE — Addendum Note (Signed)
 Addended by: Earnstine Regal on: 01/30/2024 05:50 PM   Modules accepted: Orders

## 2024-01-30 NOTE — Telephone Encounter (Signed)
 Dr. Sanjuan Dame pt - spoke w/the pt again, he stated that it's been a week and half and he still doesn't have his medication.  He stated it can be resolved w/a two minute conversation with Dr. Sanjuan Dame nurse.  He would like a call back [phone].

## 2024-02-06 ENCOUNTER — Other Ambulatory Visit: Payer: Self-pay | Admitting: Orthopaedic Surgery

## 2024-02-06 NOTE — Telephone Encounter (Signed)
 DR. Hilda Lias  Patient came in the office to show me his medicine bottle stating 28 pills.  He stopped by [facility] and asked if they can fill the rest.  They told him that the doctor will need to send in a new script for them to fill it.

## 2024-02-06 NOTE — Telephone Encounter (Signed)
 DR. Hilda Lias  Patient called stating [facility] only gave him a 7 day supply due to his insurance and they are waiting for authorization from [place].  Please call the patient back at  [phone]

## 2024-02-11 ENCOUNTER — Telehealth: Payer: Self-pay | Admitting: Orthopaedic Surgery

## 2024-02-11 NOTE — Telephone Encounter (Signed)
 Dr. Sanjuan Dame pt - spoke w/the pt, he is requesting a refill on Hydrocodone 5-325 to be sent to [facility]

## 2024-02-12 MED ORDER — HYDROCODONE-ACETAMINOPHEN 5-325 MG PO TABS: 1.0000 | ORAL_TABLET | Freq: Four times a day (QID) | ORAL | 0 refills | Status: DC | PRN

## 2024-03-11 ENCOUNTER — Telehealth: Payer: Self-pay | Admitting: Orthopaedic Surgery

## 2024-03-11 MED ORDER — HYDROCODONE-ACETAMINOPHEN 5-325 MG PO TABS: 1.0000 | ORAL_TABLET | Freq: Four times a day (QID) | ORAL | 0 refills | Status: DC | PRN

## 2024-03-11 NOTE — Telephone Encounter (Signed)
 Dr. Sanjuan Dame pt - spoke w/the pt, he is requesting a refill for Hydrocodone 5-325 to be sent to [facility]

## 2024-03-18 ENCOUNTER — Telehealth: Payer: Self-pay | Admitting: Orthopaedic Surgery

## 2024-03-18 MED ORDER — HYDROCODONE-ACETAMINOPHEN 5-325 MG PO TABS
1.0000 | ORAL_TABLET | Freq: Four times a day (QID) | ORAL | 0 refills | Status: DC | PRN
Start: 2024-03-18 — End: 2024-04-15

## 2024-03-18 NOTE — Telephone Encounter (Signed)
 Corrected pharmacy to [facility] will you please resend?

## 2024-03-18 NOTE — Telephone Encounter (Signed)
 Dr. Vicente Graham pt - pt lvm stating [facility] and [facility]  [facility] bc he didn't have it filled that last time.  He would like this corrected and [facility].

## 2024-04-15 ENCOUNTER — Ambulatory Visit: Payer: No Typology Code available for payment source | Admitting: Orthopaedic Surgery

## 2024-04-15 ENCOUNTER — Encounter: Payer: Self-pay | Admitting: Orthopaedic Surgery

## 2024-04-15 DIAGNOSIS — M542 Cervicalgia: Secondary | ICD-10-CM | POA: Diagnosis not present

## 2024-04-15 MED ORDER — HYDROCODONE-ACETAMINOPHEN 5-325 MG PO TABS: 1.0000 | ORAL_TABLET | Freq: Four times a day (QID) | ORAL | 0 refills | Status: DC | PRN

## 2024-04-15 NOTE — Progress Notes (Signed)
 My neck is about the same.  He has chronic neck pain, no new trauma, no weakness.  He has good ROM of the neck and NV intact.  No spasm.    Encounter Diagnosis  Name Primary?   Neck pain Yes   I have reviewed the [place]  Controlled Substance Reporting System web site prior to prescribing narcotic medicine for this patient.  Return in three months.  Call if any problem.  Precautions discussed.  Electronically Signed Pleasant Brilliant, MD 5/14/20259:46 AM

## 2024-05-13 ENCOUNTER — Telehealth: Payer: Self-pay | Admitting: Orthopaedic Surgery

## 2024-05-13 NOTE — Telephone Encounter (Signed)
Dr. Sanjuan Dame pt - pt lvm requesting a refill for Hydrocodone 5-325 to be sent to [facility]

## 2024-05-14 MED ORDER — HYDROCODONE-ACETAMINOPHEN 5-325 MG PO TABS: 1.0000 | ORAL_TABLET | Freq: Four times a day (QID) | ORAL | 0 refills | Status: DC | PRN

## 2024-05-20 ENCOUNTER — Telehealth: Payer: Self-pay | Admitting: Orthopaedic Surgery

## 2024-05-20 NOTE — Telephone Encounter (Signed)
 Dr. Vicente Graham pt - pt lvm requesting a refill for Hydrocodone  5-325 to be sent to [facility].  [phone]

## 2024-05-21 MED ORDER — HYDROCODONE-ACETAMINOPHEN 5-325 MG PO TABS: 1.0000 | ORAL_TABLET | Freq: Four times a day (QID) | ORAL | 0 refills | Status: DC | PRN

## 2024-06-17 ENCOUNTER — Telehealth: Payer: Self-pay

## 2024-06-17 MED ORDER — HYDROCODONE-ACETAMINOPHEN 5-325 MG PO TABS
1.0000 | ORAL_TABLET | Freq: Four times a day (QID) | ORAL | 0 refills | Status: DC | PRN
Start: 2024-06-17 — End: 2024-07-15

## 2024-06-17 NOTE — Telephone Encounter (Signed)
Hydrocodone-Acetaminophen 5/325 MG Qty 95 Tablets  PATIENT USES [facility] 

## 2024-07-15 ENCOUNTER — Encounter: Payer: Self-pay | Admitting: Orthopaedic Surgery

## 2024-07-15 ENCOUNTER — Ambulatory Visit: Payer: Self-pay | Admitting: Orthopaedic Surgery

## 2024-07-15 VITALS — BP 138/90 | HR 59 | Ht 73.0 in | Wt 200.0 lb

## 2024-07-15 DIAGNOSIS — M542 Cervicalgia: Secondary | ICD-10-CM

## 2024-07-15 MED ORDER — HYDROCODONE-ACETAMINOPHEN 5-325 MG PO TABS: 1.0000 | ORAL_TABLET | Freq: Four times a day (QID) | ORAL | 0 refills | Status: DC | PRN

## 2024-07-15 NOTE — Progress Notes (Signed)
 My neck hurts with rainy weather we have today  He has neck pain worse on rainy days and cold weather.  He has no numbness.  He has no trauma.  ROM of the neck is painful today but full.  He has no spasm.  NV intact.  Grips normal.  Encounter Diagnosis  Name Primary?   Neck pain Yes   I will renew pain medicine.  I have reviewed the [place]  Controlled Substance Reporting System web site prior to prescribing narcotic medicine for this patient.  Return in three months.  Call if any problem.  Precautions discussed.  Electronically Signed Lemond Stable, MD 8/13/20259:02 AM

## 2024-07-24 ENCOUNTER — Encounter: Payer: Self-pay | Admitting: Radiology

## 2024-08-24 ENCOUNTER — Telehealth: Payer: Self-pay | Admitting: Orthopaedic Surgery

## 2024-08-24 NOTE — Telephone Encounter (Signed)
Dr. Sanjuan Dame pt - pt lvm requesting a refill for Hydrocodone 5-325 to be sent to [facility]

## 2024-08-25 MED ORDER — HYDROCODONE-ACETAMINOPHEN 5-325 MG PO TABS: 1.0000 | ORAL_TABLET | Freq: Four times a day (QID) | ORAL | 0 refills | Status: DC | PRN

## 2024-09-22 ENCOUNTER — Telehealth: Payer: Self-pay | Admitting: Orthopedic Surgery

## 2024-09-22 NOTE — Telephone Encounter (Signed)
 Dr. Onesimo pt, former Dr. MARLA pt - pt lvm requesting a refill for Hydrocodone  5-325 to be sent to [facility].  He has an appt on 10/14/24 for a f/u and establish.

## 2024-09-23 MED ORDER — HYDROCODONE-ACETAMINOPHEN 5-325 MG PO TABS: 1.0000 | ORAL_TABLET | Freq: Four times a day (QID) | ORAL | 0 refills | Status: AC | PRN

## 2024-09-23 NOTE — Addendum Note (Signed)
 Addended by: ONESIMO ANES A on: 09/23/2024 03:45 PM   Modules accepted: Orders

## 2024-10-05 ENCOUNTER — Encounter: Payer: Self-pay | Admitting: Radiology

## 2024-10-14 ENCOUNTER — Encounter: Payer: Self-pay | Admitting: Orthopedic Surgery

## 2024-10-14 ENCOUNTER — Ambulatory Visit (INDEPENDENT_AMBULATORY_CARE_PROVIDER_SITE_OTHER): Payer: Self-pay | Admitting: Orthopedic Surgery

## 2024-10-14 ENCOUNTER — Ambulatory Visit: Payer: Self-pay | Admitting: Orthopaedic Surgery

## 2024-10-14 VITALS — BP 124/77 | HR 83 | Ht 73.0 in | Wt 196.0 lb

## 2024-10-14 DIAGNOSIS — M542 Cervicalgia: Secondary | ICD-10-CM

## 2024-10-14 DIAGNOSIS — G8929 Other chronic pain: Secondary | ICD-10-CM

## 2024-10-14 MED ORDER — HYDROCODONE-ACETAMINOPHEN 5-325 MG PO TABS: 1.0000 | ORAL_TABLET | Freq: Four times a day (QID) | ORAL | 0 refills | Status: AC | PRN

## 2024-10-14 NOTE — Patient Instructions (Signed)
 Contact the office for a follow up once you have insurance straightened out.

## 2024-10-14 NOTE — Progress Notes (Signed)
 New Patient Visit  Assessment: David Mccarthy is a 43 y.o. male with the following: 1. Chronic neck pain  Plan: David Mccarthy has chronic neck pain.  He has been on hydrocodone  for 10 or more years.  He is having some numbness and tingling in the right hand.  Otherwise, there are no associated symptoms.  Restricted neck range of motion.  We do not have updated imaging, but he would like to avoid this, as he does not currently have insurance.  I think this is reasonable.  He will return to clinic for updated imaging when he has his insurance straightened out.  Provided 1 more prescription for hydrocodone .  We discussed a chronic pain management referral, but he would like to avoid this as well.  If he returns to clinic for imaging, we can consider a referral for injections or evaluation by spine surgeon.  Follow-up: Return if symptoms worsen or fail to improve.  Subjective:  Chief Complaint  Patient presents with   Neck Pain    For yrs, pt is a corporate investment banker. Doesn't have insurance at the moment so would like to wait to get x-rays.     History of Present Illness: David Mccarthy is a 43 y.o. male who presents for evaluation of chronic neck pain.  He states that he sustained multiple neck injuries to the greater than 10 years ago.  He has been seeing Dr. Brenna for years.  He has been taking hydrocodone  on a regular basis for 10 or more years.  He is complaining of pain in his neck.  He has no radiating pains, however he does have some numbness and tingling in the right hand.  He works as a corporate investment banker.  He is left-hand dominant.  He denies issues with his balance.  He notes no difference in his strength.   Review of Systems: No fevers or chills + numbness or tingling No chest pain No shortness of breath No bowel or bladder dysfunction No GI distress No headaches   Medical History:  Past Medical History:  Diagnosis Date   ADHD (attention deficit hyperactivity disorder)      Past Surgical History:  Procedure Laterality Date   INNER EAR SURGERY     NOSE SURGERY     TONSILLECTOMY      No family history on file. Social History   Tobacco Use   Smoking status: Never   Smokeless tobacco: Never  Substance Use Topics   Alcohol use: Yes    Comment: occasional, once every 2 weeks   Drug use: No    No Known Allergies  Current Meds  Medication Sig   amphetamine -dextroamphetamine  (ADDERALL) 10 MG tablet Take 1 tablet (10 mg total) by mouth 2 (two) times daily.   cyclobenzaprine  (FLEXERIL ) 10 MG tablet 1/2 to one tab po TID prn spasm   HYDROcodone -acetaminophen  (NORCO/VICODIN) 5-325 MG tablet Take 1 tablet by mouth every 6 (six) hours as needed.    Objective: BP 124/77   Pulse 83   Ht 6' 1 (1.854 m)   Wt 196 lb (88.9 kg)   BMI 25.86 kg/m   Physical Exam:  General: Alert and oriented. and No acute distress. Gait: Normal gait.  Neck without deformity.  Point tenderness in the posterior aspect of the neck.  Restricted range of motion with both rotation and extension.  He has good upper body strength bilaterally.  5/5 grip strength.  Slightly decreased sensation to the fingers on his right hand.  IMAGING:  No new imaging obtained today   New Medications:  Meds ordered this encounter  Medications   HYDROcodone -acetaminophen  (NORCO/VICODIN) 5-325 MG tablet    Sig: Take 1 tablet by mouth every 6 (six) hours as needed.    Dispense:  28 tablet    Refill:  0      Oneil DELENA Horde, MD  10/14/2024 9:03 AM

## 2024-10-16 ENCOUNTER — Ambulatory Visit: Payer: Self-pay | Admitting: Orthopedic Surgery
